# Patient Record
Sex: Male | Born: 1955 | Race: White | Hispanic: No | Marital: Married | Smoking: Never smoker
Health system: Southern US, Community
[De-identification: ages and names within clinical notes are randomized; demographics above are authoritative.]

## PROBLEM LIST (undated history)

## (undated) DIAGNOSIS — J302 Other seasonal allergic rhinitis: Secondary | ICD-10-CM

## (undated) DIAGNOSIS — I10 Essential (primary) hypertension: Secondary | ICD-10-CM

## (undated) DIAGNOSIS — E785 Hyperlipidemia, unspecified: Secondary | ICD-10-CM

## (undated) HISTORY — DX: Essential (primary) hypertension: I10

## (undated) HISTORY — PX: NASAL SINUS SURGERY: SHX719

## (undated) HISTORY — DX: Hyperlipidemia, unspecified: E78.5

## (undated) HISTORY — PX: SHOULDER SURGERY: SHX246

---

## 2000-05-09 ENCOUNTER — Ambulatory Visit (HOSPITAL_COMMUNITY): Admission: RE | Admit: 2000-05-09 | Discharge: 2000-05-09 | Payer: Self-pay | Admitting: Neurosurgery

## 2000-05-09 ENCOUNTER — Encounter: Payer: Self-pay | Admitting: Neurosurgery

## 2012-02-15 ENCOUNTER — Encounter (INDEPENDENT_AMBULATORY_CARE_PROVIDER_SITE_OTHER): Payer: Self-pay | Admitting: General Surgery

## 2012-02-15 ENCOUNTER — Ambulatory Visit (INDEPENDENT_AMBULATORY_CARE_PROVIDER_SITE_OTHER): Payer: BC Managed Care – PPO | Admitting: General Surgery

## 2012-02-15 VITALS — BP 142/90 | HR 72 | Temp 97.2°F | Resp 20 | Ht 76.0 in | Wt 213.0 lb

## 2012-02-15 DIAGNOSIS — E7889 Other lipoprotein metabolism disorders: Secondary | ICD-10-CM

## 2012-02-15 DIAGNOSIS — E882 Lipomatosis, not elsewhere classified: Secondary | ICD-10-CM

## 2012-02-15 NOTE — Progress Notes (Signed)
Patient ID: Juan Arroyo, male   DOB: Dec 06, 1955, 56 y.o.   MRN: [id number]  Chief Complaint  Patient presents with  . Lipoma    HPI Juan Arroyo is a 56 y.o. male.   HPI This is a 56 year old male with multiple subcutaneous masses that have been consistent with lipomas and present for a long time. There are number of these are bothering him. He also is worried due to the fact that he just has these lumps. He comes in today to discuss excision of some of these or all of them. He is also due to get foot surgery at some point and would like to discuss having these combined. Past Medical History  Diagnosis Date  . Hypertension   . Hyperlipidemia     Past Surgical History  Procedure Date  . Shoulder surgery     right  . Nasal sinus surgery     History reviewed. No pertinent family history.  Social History History  Substance Use Topics  . Smoking status: Never Smoker   . Smokeless tobacco: Not on file  . Alcohol Use: Yes    No Known Allergies  Current Outpatient Prescriptions  Medication Sig Dispense Refill  . aspirin 81 MG tablet Take 81 mg by mouth daily.      . cetirizine (ZYRTEC) 10 MG tablet Take 10 mg by mouth daily.      Marland Kitchen losartan-hydrochlorothiazide (HYZAAR) 100-12.5 MG per tablet Take 1 tablet by mouth daily.      . naproxen sodium (ANAPROX) 220 MG tablet Take 220 mg by mouth 2 (two) times daily with a meal.      . pravastatin (PRAVACHOL) 40 MG tablet Take 40 mg by mouth daily.        Review of Systems Review of Systems  Blood pressure 142/90, pulse 72, temperature 97.2 F (36.2 C), temperature source Temporal, resp. rate 20, height 6\' 4"  (1.93 m), weight 213 lb (96.616 kg).  Physical Exam Physical Exam  Vitals reviewed. Constitutional: He appears well-developed and well-nourished.  Musculoskeletal:       Arms:      Legs:     Assessment    Lipomatosis    Plan    I discussed with him that these did not need to be excised as they all  appear to be benign. He however would like to have these excised. I discussed 2 options. One would be excising these sequentially in the office although I think some of them will need to be done in the operating room specifically on his triceps area. He is due to get foot surgery and I  would be perfectly willing to remove most or all of these in the operating room in combination with his foot surgery. He is going to see Dr. Victorino Dike. If he agrees to this I will be more than happy to combine these procedures together. We discussed the risks and benefits of excision of these lipomas as well as the possible risks including seroma and possible further lipomas forming.       Koreen Lizaola 02/15/2012, 10:03 AM

## 2012-06-10 ENCOUNTER — Ambulatory Visit (INDEPENDENT_AMBULATORY_CARE_PROVIDER_SITE_OTHER): Payer: BC Managed Care – PPO | Admitting: General Surgery

## 2012-06-10 ENCOUNTER — Encounter (INDEPENDENT_AMBULATORY_CARE_PROVIDER_SITE_OTHER): Payer: Self-pay | Admitting: General Surgery

## 2012-06-10 VITALS — BP 134/84 | HR 76 | Temp 97.9°F | Ht 76.0 in | Wt 220.2 lb

## 2012-06-10 DIAGNOSIS — E7889 Other lipoprotein metabolism disorders: Secondary | ICD-10-CM

## 2012-06-10 DIAGNOSIS — E882 Lipomatosis, not elsewhere classified: Secondary | ICD-10-CM

## 2012-06-10 NOTE — Progress Notes (Signed)
Subjective:     Patient ID: Juan Arroyo, male   DOB: October 28, 1955, 56 y.o.   MRN: [id number]  HPI This is 47 yom who I have seen previously with multiple lipomas some of which are symptomatic.  He is going to have right foot surgery on 23 January with Dr. Victorino Dike and would like to combine procedures. He comes in today to discuss what lipomas he would like to have removed.  Review of Systems     Objective:   Physical Exam Left posterior triceps lipoma 3x2 cm Posterior left deltoid lipoma 1x2 cm Right posterior triceps lipoma 2x3 cm Right lateral abdominal wall lipoma 1x1 cm Left lateral abdominal wall lipoma at belt line <1 cm Left thigh lipoma 2x2 cm These are ones he would like removed He also has left biceps 1x1 cm, left forearm 1x1cm, right lateral arm 1x1 cm    Assessment:     Lipomatosis    Plan:     We discussed excision of the six above. These are most symptomatic and the others could be removed eventually in office if he would like. I told him these are all by choice and that they certainly clinically appear to be lipomas and don't have to be removed. They are however symptomatic.  We discussed risk of infection.  Will plan on doing at same time as foot surgery.

## 2012-07-29 ENCOUNTER — Encounter (HOSPITAL_BASED_OUTPATIENT_CLINIC_OR_DEPARTMENT_OTHER): Payer: Self-pay | Admitting: *Deleted

## 2012-07-29 NOTE — Progress Notes (Signed)
To come in for [id number]  

## 2012-07-30 ENCOUNTER — Encounter (HOSPITAL_BASED_OUTPATIENT_CLINIC_OR_DEPARTMENT_OTHER)
Admission: RE | Admit: 2012-07-30 | Discharge: 2012-07-30 | Disposition: A | Discharge status: Home / Self Care | Payer: BC Managed Care – PPO | Source: Ambulatory Visit | Attending: Orthopedic Surgery | Admitting: Orthopedic Surgery

## 2012-07-30 LAB — BASIC METABOLIC PANEL WITH GFR
BUN: 12 mg/dL (ref 6–23)
CO2: 27 meq/L (ref 19–32)
Calcium: 9.6 mg/dL (ref 8.4–10.5)
Chloride: 98 meq/L (ref 96–112)
Creatinine, Ser: 0.95 mg/dL (ref 0.50–1.35)
GFR calc Af Amer: >90 mL/min
GFR calc non Af Amer: >90 mL/min
Glucose, Bld: 95 mg/dL (ref 70–99)
Potassium: 4.3 meq/L (ref 3.5–5.1)
Sodium: 137 meq/L (ref 135–145)

## 2012-07-31 ENCOUNTER — Other Ambulatory Visit: Payer: Self-pay | Admitting: Orthopedic Surgery

## 2012-08-01 ENCOUNTER — Encounter (HOSPITAL_BASED_OUTPATIENT_CLINIC_OR_DEPARTMENT_OTHER): Payer: Self-pay | Admitting: Anesthesiology

## 2012-08-01 ENCOUNTER — Encounter (HOSPITAL_BASED_OUTPATIENT_CLINIC_OR_DEPARTMENT_OTHER)
Admission: RE | Disposition: A | Discharge status: Home / Self Care | Payer: Self-pay | Source: Ambulatory Visit | Attending: Orthopedic Surgery

## 2012-08-01 ENCOUNTER — Ambulatory Visit (HOSPITAL_BASED_OUTPATIENT_CLINIC_OR_DEPARTMENT_OTHER)
Admission: RE | Admit: 2012-08-01 | Discharge: 2012-08-01 | Disposition: A | Discharge status: Home / Self Care | Payer: BC Managed Care – PPO | Source: Ambulatory Visit | Attending: Orthopedic Surgery | Admitting: Orthopedic Surgery

## 2012-08-01 ENCOUNTER — Encounter (HOSPITAL_BASED_OUTPATIENT_CLINIC_OR_DEPARTMENT_OTHER): Payer: Self-pay | Admitting: *Deleted

## 2012-08-01 ENCOUNTER — Ambulatory Visit (HOSPITAL_BASED_OUTPATIENT_CLINIC_OR_DEPARTMENT_OTHER): Payer: BC Managed Care – PPO | Admitting: Anesthesiology

## 2012-08-01 DIAGNOSIS — D1739 Benign lipomatous neoplasm of skin and subcutaneous tissue of other sites: Secondary | ICD-10-CM

## 2012-08-01 DIAGNOSIS — Z79899 Other long term (current) drug therapy: Secondary | ICD-10-CM | POA: Insufficient documentation

## 2012-08-01 DIAGNOSIS — D1779 Benign lipomatous neoplasm of other sites: Secondary | ICD-10-CM | POA: Insufficient documentation

## 2012-08-01 DIAGNOSIS — M2010 Hallux valgus (acquired), unspecified foot: Secondary | ICD-10-CM | POA: Insufficient documentation

## 2012-08-01 DIAGNOSIS — Z01812 Encounter for preprocedural laboratory examination: Secondary | ICD-10-CM | POA: Insufficient documentation

## 2012-08-01 DIAGNOSIS — E785 Hyperlipidemia, unspecified: Secondary | ICD-10-CM | POA: Insufficient documentation

## 2012-08-01 DIAGNOSIS — I10 Essential (primary) hypertension: Secondary | ICD-10-CM | POA: Insufficient documentation

## 2012-08-01 DIAGNOSIS — Z7982 Long term (current) use of aspirin: Secondary | ICD-10-CM | POA: Insufficient documentation

## 2012-08-01 DIAGNOSIS — M2040 Other hammer toe(s) (acquired), unspecified foot: Secondary | ICD-10-CM | POA: Insufficient documentation

## 2012-08-01 HISTORY — PX: LIPOMA EXCISION: SHX5283

## 2012-08-01 HISTORY — PX: HAMMERTOE RECONSTRUCTION WITH WEIL OSTEOTOMY: SHX5631

## 2012-08-01 HISTORY — DX: Other seasonal allergic rhinitis: J30.2

## 2012-08-01 LAB — POCT HEMOGLOBIN-HEMACUE: Hemoglobin: 16.1 g/dL (ref 13.0–17.0)

## 2012-08-01 SURGERY — HAMMERTOE RECONSTRUCTION WITH WEIL OSTEOTOMY
Anesthesia: Regional | Site: Toe | Laterality: Right | Wound class: Clean

## 2012-08-01 MED ORDER — OXYCODONE HCL 5 MG PO TABS: 5.0000 mg | ORAL_TABLET | Freq: Once | ORAL | Status: DC | PRN

## 2012-08-01 MED ORDER — ONDANSETRON HCL 4 MG/2ML IJ SOLN: 4.0000 mg | Freq: Once | INTRAMUSCULAR | Status: DC | PRN

## 2012-08-01 MED ORDER — GLYCOPYRROLATE 0.2 MG/ML IJ SOLN
INTRAMUSCULAR | Status: DC | PRN
  Administered 2012-08-01: 0.2 mg via INTRAVENOUS

## 2012-08-01 MED ORDER — OXYCODONE HCL 5 MG PO TABS: 5.0000 mg | ORAL_TABLET | ORAL | Status: DC | PRN

## 2012-08-01 MED ORDER — LIDOCAINE HCL (CARDIAC) 20 MG/ML IV SOLN
INTRAVENOUS | Status: DC | PRN
  Administered 2012-08-01: 60 mg via INTRAVENOUS

## 2012-08-01 MED ORDER — BUPIVACAINE-EPINEPHRINE PF 0.5-1:200000 % IJ SOLN
INTRAMUSCULAR | Status: DC | PRN
  Administered 2012-08-01: 25 mL

## 2012-08-01 MED ORDER — CHLORHEXIDINE GLUCONATE 4 % EX LIQD: 60.0000 mL | Freq: Once | CUTANEOUS | Status: DC

## 2012-08-01 MED ORDER — LIDOCAINE HCL 1 % IJ SOLN
INTRAMUSCULAR | Status: DC | PRN
  Administered 2012-08-01: 08:00:00 via INTRAMUSCULAR

## 2012-08-01 MED ORDER — ONDANSETRON HCL 4 MG/2ML IJ SOLN
INTRAMUSCULAR | Status: DC | PRN
  Administered 2012-08-01: 4 mg via INTRAVENOUS

## 2012-08-01 MED ORDER — DEXAMETHASONE SODIUM PHOSPHATE 10 MG/ML IJ SOLN
INTRAMUSCULAR | Status: DC | PRN
  Administered 2012-08-01: 7 mg
  Administered 2012-08-01: 10 mg via INTRAVENOUS

## 2012-08-01 MED ORDER — MIDAZOLAM HCL 2 MG/2ML IJ SOLN
1.0000 mg | INTRAMUSCULAR | Status: DC | PRN
  Administered 2012-08-01: 2 mg via INTRAVENOUS

## 2012-08-01 MED ORDER — CEFAZOLIN SODIUM-DEXTROSE 2-3 GM-% IV SOLR
2.0000 g | INTRAVENOUS | Status: AC
  Administered 2012-08-01: 2 g via INTRAVENOUS

## 2012-08-01 MED ORDER — BACITRACIN ZINC 500 UNIT/GM EX OINT
TOPICAL_OINTMENT | CUTANEOUS | Status: DC | PRN
  Administered 2012-08-01: 1 via TOPICAL

## 2012-08-01 MED ORDER — PROPOFOL 10 MG/ML IV BOLUS
INTRAVENOUS | Status: DC | PRN
  Administered 2012-08-01: 300 mg via INTRAVENOUS

## 2012-08-01 MED ORDER — HYDROMORPHONE HCL PF 1 MG/ML IJ SOLN: 0.2500 mg | INTRAMUSCULAR | Status: DC | PRN

## 2012-08-01 MED ORDER — EPHEDRINE SULFATE 50 MG/ML IJ SOLN
INTRAMUSCULAR | Status: DC | PRN
  Administered 2012-08-01 (×4): 10 mg via INTRAVENOUS

## 2012-08-01 MED ORDER — LACTATED RINGERS IV SOLN
INTRAVENOUS | Status: DC
  Administered 2012-08-01 (×3): via INTRAVENOUS

## 2012-08-01 MED ORDER — SODIUM CHLORIDE 0.9 % IV SOLN: INTRAVENOUS | Status: DC

## 2012-08-01 MED ORDER — FENTANYL CITRATE 0.05 MG/ML IJ SOLN
50.0000 ug | INTRAMUSCULAR | Status: DC | PRN
  Administered 2012-08-01: 100 ug via INTRAVENOUS

## 2012-08-01 MED ORDER — OXYCODONE HCL 5 MG/5ML PO SOLN: 5.0000 mg | Freq: Once | ORAL | Status: DC | PRN

## 2012-08-01 MED ORDER — FENTANYL CITRATE 0.05 MG/ML IJ SOLN
INTRAMUSCULAR | Status: DC | PRN
  Administered 2012-08-01: 50 ug via INTRAVENOUS
  Administered 2012-08-01: 100 ug via INTRAVENOUS

## 2012-08-01 SURGICAL SUPPLY — 102 items
ADH SKN CLS APL DERMABOND .7 (GAUZE/BANDAGES/DRESSINGS) ×8
BANDAGE CONFORM 2  STR LF (GAUZE/BANDAGES/DRESSINGS) ×3 IMPLANT
BANDAGE CONFORM 3  STR LF (GAUZE/BANDAGES/DRESSINGS) IMPLANT
BANDAGE ESMARK 6X9 LF (GAUZE/BANDAGES/DRESSINGS) IMPLANT
BIT DRILL 1.7 LOW PROFILE (BIT) ×1 IMPLANT
BLADE AVERAGE 25X9 (BLADE) ×3 IMPLANT
BLADE SURG 15 STRL LF DISP TIS (BLADE) ×6 IMPLANT
BLADE SURG 15 STRL SS (BLADE) ×24
BLADE SURG ROTATE 9660 (MISCELLANEOUS) ×1 IMPLANT
BNDG CMPR 9X4 STRL LF SNTH (GAUZE/BANDAGES/DRESSINGS)
BNDG CMPR 9X6 STRL LF SNTH (GAUZE/BANDAGES/DRESSINGS) ×2
BNDG COHESIVE 4X5 TAN STRL (GAUZE/BANDAGES/DRESSINGS) ×3 IMPLANT
BNDG ESMARK 4X9 LF (GAUZE/BANDAGES/DRESSINGS) IMPLANT
BNDG ESMARK 6X9 LF (GAUZE/BANDAGES/DRESSINGS) ×3
CANISTER SUCTION 1200CC (MISCELLANEOUS) IMPLANT
CAP PIN PROTECTOR ORTHO WHT (CAP) ×1 IMPLANT
CHLORAPREP W/TINT 26ML (MISCELLANEOUS) ×6 IMPLANT
CLOTH BEACON ORANGE TIMEOUT ST (SAFETY) ×6 IMPLANT
COVER MAYO STAND STRL (DRAPES) ×4 IMPLANT
COVER TABLE BACK 60X90 (DRAPES) ×6 IMPLANT
CUFF TOURNIQUET SINGLE 18IN (TOURNIQUET CUFF) IMPLANT
CUFF TOURNIQUET SINGLE 34IN LL (TOURNIQUET CUFF) ×1 IMPLANT
DECANTER SPIKE VIAL GLASS SM (MISCELLANEOUS) IMPLANT
DERMABOND ADVANCED (GAUZE/BANDAGES/DRESSINGS) ×4
DERMABOND ADVANCED .7 DNX12 (GAUZE/BANDAGES/DRESSINGS) ×2 IMPLANT
DRAPE EXTREMITY T 121X128X90 (DRAPE) ×3 IMPLANT
DRAPE OEC MINIVIEW 54X84 (DRAPES) ×3 IMPLANT
DRAPE PED LAPAROTOMY (DRAPES) ×2 IMPLANT
DRAPE U-SHAPE 47X51 STRL (DRAPES) ×3 IMPLANT
DRSG EMULSION OIL 3X3 NADH (GAUZE/BANDAGES/DRESSINGS) ×4 IMPLANT
DRSG PAD ABDOMINAL 8X10 ST (GAUZE/BANDAGES/DRESSINGS) ×2 IMPLANT
DRSG TEGADERM 4X4.75 (GAUZE/BANDAGES/DRESSINGS) IMPLANT
ELECT COATED BLADE 2.86 ST (ELECTRODE) ×1 IMPLANT
ELECT REM PT RETURN 9FT ADLT (ELECTROSURGICAL) ×3
ELECTRODE REM PT RTRN 9FT ADLT (ELECTROSURGICAL) ×4 IMPLANT
GAUZE PACKING IODOFORM 1/4X5 (PACKING) IMPLANT
GAUZE SPONGE 4X4 12PLY STRL LF (GAUZE/BANDAGES/DRESSINGS) ×1 IMPLANT
GLOVE BIO SURGEON STRL SZ 6.5 (GLOVE) ×2 IMPLANT
GLOVE BIO SURGEON STRL SZ7 (GLOVE) ×4 IMPLANT
GLOVE BIO SURGEON STRL SZ8 (GLOVE) ×4 IMPLANT
GLOVE BIOGEL PI IND STRL 7.0 (GLOVE) IMPLANT
GLOVE BIOGEL PI IND STRL 7.5 (GLOVE) ×2 IMPLANT
GLOVE BIOGEL PI IND STRL 8 (GLOVE) ×2 IMPLANT
GLOVE BIOGEL PI INDICATOR 7.0 (GLOVE) ×2
GLOVE BIOGEL PI INDICATOR 7.5 (GLOVE) ×1
GLOVE BIOGEL PI INDICATOR 8 (GLOVE) ×2
GLOVE ECLIPSE 6.5 STRL STRAW (GLOVE) ×1 IMPLANT
GLOVE EXAM NITRILE EXT CUFF MD (GLOVE) ×2 IMPLANT
GOWN PREVENTION PLUS XLARGE (GOWN DISPOSABLE) ×8 IMPLANT
GOWN PREVENTION PLUS XXLARGE (GOWN DISPOSABLE) ×4 IMPLANT
K-WIRE .054X4 (WIRE) ×1 IMPLANT
K-WIRE SGLE END .054 LG (WIRE) ×6
KWIRE SGLE END .054 LG (WIRE) IMPLANT
NDL HYPO 25X1 1.5 SAFETY (NEEDLE) ×2 IMPLANT
NEEDLE HYPO 22GX1.5 SAFETY (NEEDLE) IMPLANT
NEEDLE HYPO 25X1 1.5 SAFETY (NEEDLE) ×3 IMPLANT
NS IRRIG 1000ML POUR BTL (IV SOLUTION) ×3 IMPLANT
PACK BASIN DAY SURGERY FS (CUSTOM PROCEDURE TRAY) ×6 IMPLANT
PAD CAST 4YDX4 CTTN HI CHSV (CAST SUPPLIES) ×2 IMPLANT
PADDING CAST ABS 4INX4YD NS (CAST SUPPLIES)
PADDING CAST ABS COTTON 4X4 ST (CAST SUPPLIES) ×2 IMPLANT
PADDING CAST COTTON 4X4 STRL (CAST SUPPLIES) ×3
PASSER SUT SWANSON 36MM LOOP (INSTRUMENTS) IMPLANT
PENCIL BUTTON HOLSTER BLD 10FT (ELECTRODE) ×4 IMPLANT
SCREW CORTICAL 2.3X18 (Screw) ×1 IMPLANT
SCREW LP CORTICAL 2.3X22MM (Screw) ×1 IMPLANT
SCREW QUICK FIX 2X12 (Screw) ×2 IMPLANT
SCREW QUICK FIX 2X14 (Screw) ×1 IMPLANT
SHEET MEDIUM DRAPE 40X70 STRL (DRAPES) ×3 IMPLANT
SLEEVE SCD COMPRESS KNEE MED (MISCELLANEOUS) ×3 IMPLANT
SPONGE GAUZE 4X4 12PLY (GAUZE/BANDAGES/DRESSINGS) ×1 IMPLANT
SPONGE LAP 18X18 X RAY DECT (DISPOSABLE) ×3 IMPLANT
STAPLER VISISTAT 35W (STAPLE) ×1 IMPLANT
STOCKINETTE 6  STRL (DRAPES) ×1
STOCKINETTE 6 STRL (DRAPES) ×2 IMPLANT
SUCTION FRAZIER TIP 10 FR DISP (SUCTIONS) IMPLANT
SUT ETHILON 2 0 FS 18 (SUTURE) ×2 IMPLANT
SUT ETHILON 3 0 FSL (SUTURE) IMPLANT
SUT ETHILON 3 0 PS 1 (SUTURE) ×3 IMPLANT
SUT MERSILENE 2.0 SH NDLE (SUTURE) IMPLANT
SUT MNCRL AB 3-0 PS2 18 (SUTURE) ×3 IMPLANT
SUT MNCRL AB 4-0 PS2 18 (SUTURE) ×3 IMPLANT
SUT SILK 2 0 SH (SUTURE) IMPLANT
SUT VIC AB 2-0 CT1 27 (SUTURE)
SUT VIC AB 2-0 CT1 TAPERPNT 27 (SUTURE) IMPLANT
SUT VIC AB 2-0 SH 18 (SUTURE) IMPLANT
SUT VIC AB 2-0 SH 27 (SUTURE)
SUT VIC AB 2-0 SH 27XBRD (SUTURE) IMPLANT
SUT VIC AB 3-0 PS1 18 (SUTURE)
SUT VIC AB 3-0 PS1 18XBRD (SUTURE) IMPLANT
SUT VICRYL 3-0 CR8 SH (SUTURE) ×1 IMPLANT
SUT VICRYL 4-0 PS2 18IN ABS (SUTURE) IMPLANT
SWAB COLLECTION DEVICE MRSA (MISCELLANEOUS) IMPLANT
SYR BULB 3OZ (MISCELLANEOUS) ×3 IMPLANT
SYR CONTROL 10ML LL (SYRINGE) ×3 IMPLANT
TOWEL OR 17X24 6PK STRL BLUE (TOWEL DISPOSABLE) ×9 IMPLANT
TOWEL OR NON WOVEN STRL DISP B (DISPOSABLE) ×3 IMPLANT
TUBE ANAEROBIC SPECIMEN COL (MISCELLANEOUS) IMPLANT
TUBE CONNECTING 20X1/4 (TUBING) IMPLANT
UNDERPAD 30X30 INCONTINENT (UNDERPADS AND DIAPERS) ×3 IMPLANT
WATER STERILE IRR 1000ML POUR (IV SOLUTION) ×2 IMPLANT
YANKAUER SUCT BULB TIP NO VENT (SUCTIONS) IMPLANT

## 2012-08-01 NOTE — Brief Op Note (Signed)
08/01/2012  10:19 AM  PATIENT:  Juan Arroyo  57 y.o. 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  Right 1st MT scarf osteotomy 2.  Right modified mcBride bunionectomy 3.  Right 2nd, 3rd and 4th MT Weil osteotomies 4.  Right 2nd, 3rd, 4th and 5th MTP joint dorsal capsulotomies with extensor tendon lengthening 5.  Right 2nd, 3rd, 4th and 5th toe percutaneous FDL tenotomies 6.  Right 2nd, 3rd and 4th toe hammertoe corrections 7.  Fluoro > 1 hour  SURGEON:  Toni Arthurs, MD  ASSISTANT: n/a  ANESTHESIA:   General, regional  EBL:  minimal   TOURNIQUET:   Total Tourniquet Time Documented: Thigh (Right) - 82 minutes  COMPLICATIONS:  None apparent  DISPOSITION:  Extubated, awake and stable to recovery.  DICTATION ID:   [id number]

## 2012-08-01 NOTE — OR Nursing (Signed)
Dr. Victorino Dike scrubbed in for foot procedure @ 0815, incision @ 0825.  Dr. Dwain Sarna completed his procedure @ 0845.

## 2012-08-01 NOTE — H&P (Signed)
  HPI  This is a 57 year old male with multiple subcutaneous masses that have been consistent with lipomas and present for a long time. There are number of these are bothering him. He also is worried due to the fact that he just has these lumps. He comes in today to discuss excision of some of these or all of them. He is also due to get foot surgery at some point and would like to discuss having these combined.  Past Medical History   Diagnosis  Date   .  Hypertension    .  Hyperlipidemia     Past Surgical History   Procedure  Date   .  Shoulder surgery      right   .  Nasal sinus surgery     History reviewed. No pertinent family history.  Social History  History   Substance Use Topics   .  Smoking status:  Never Smoker   .  Smokeless tobacco:  Not on file   .  Alcohol Use:  Yes    No Known Allergies  Current Outpatient Prescriptions   Medication  Sig  Dispense  Refill   .  aspirin 81 MG tablet  Take 81 mg by mouth daily.     .  cetirizine (ZYRTEC) 10 MG tablet  Take 10 mg by mouth daily.     Marland Kitchen  losartan-hydrochlorothiazide (HYZAAR) 100-12.5 MG per tablet  Take 1 tablet by mouth daily.     .  naproxen sodium (ANAPROX) 220 MG tablet  Take 220 mg by mouth 2 (two) times daily with a meal.     .  pravastatin (PRAVACHOL) 40 MG tablet  Take 40 mg by mouth daily.      Review of Systems  Review of Systems  Blood pressure 142/90, pulse 72, temperature 97.2 F (36.2 C), temperature source Temporal, resp. rate 20, height 6\' 4"  (1.93 m), weight 213 lb (96.616 kg).  Physical Exam  Physical Exam  Vitals reviewed.  Constitutional: He appears well-developed and well-nourished.  Left posterior triceps lipoma 3x2 cm  Posterior left deltoid lipoma 1x2 cm  Right posterior triceps lipoma 2x3 cm  Right lateral abdominal wall lipoma 1x1 cm  Left lateral abdominal wall lipoma at belt line <1 cm  Left thigh lipoma 2x2 cm  These are ones he would like removed  He also has left biceps 1x1 cm, left  forearm 1x1cm, right lateral arm 1x1 cm   Assessment   Lipomatosis   Plan   I discussed with him that these did not need to be excised as they all appear to be benign. He however would like to have these excised. I discussed 2 options. One would be excising these sequentially in the office although I think some of them will need to be done in the operating room specifically on his triceps area. He is due to get foot surgery and I would be perfectly willing to remove most or all of these in the operating room in combination with his foot surgery. He is going to see Dr. Victorino Dike. If he agrees to this I will be more than happy to combine these procedures together. We discussed the risks and benefits of excision of these lipomas as well as the possible risks including seroma and possible further lipomas forming.

## 2012-08-01 NOTE — Anesthesia Preprocedure Evaluation (Signed)

## 2012-08-01 NOTE — Anesthesia Procedure Notes (Addendum)
Anesthesia Regional Block:  Ankle block  Pre-Anesthetic Checklist: ,, timeout performed, Correct Patient, Correct Site, Correct Laterality, Correct Procedure, Correct Position, site marked, Risks and benefits discussed,  Surgical consent,  Pre-op evaluation,  At surgeon's request and post-op pain management  Laterality: Right and Lower  Prep: chloraprep       Needles:  Injection technique: Single-shot  Needle Type: Quincke     Needle Length: 9cm  Needle Gauge: 22 and 22 G    Additional Needles:  Procedures: ultrasound guided (picture in chart) Ankle block Narrative:  Start time: 08/01/2012 7:06 AM End time: 08/01/2012 7:12 AM Injection made incrementally with aspirations every 5 mL.  Performed by: Personally  Anesthesiologist: Sheldon Silvan, MD  Additional Notes: Marcaine 0.5% with EPI [id number]  Femoral nerve block Procedure Name: LMA Insertion Date/Time: 08/01/2012 8:43 AM Performed by: Burna Cash Pre-anesthesia Checklist: Patient identified, Emergency Drugs available, Suction available and Patient being monitored Patient Re-evaluated:Patient Re-evaluated prior to inductionOxygen Delivery Method: Circle System Utilized Preoxygenation: Pre-oxygenation with 100% oxygen Intubation Type: IV induction Ventilation: Mask ventilation without difficulty LMA: LMA inserted LMA Size: 5.0 Number of attempts: 1 Airway Equipment and Method: bite block Placement Confirmation: positive ETCO2 Tube secured with: Tape Dental Injury: Teeth and Oropharynx as per pre-operative assessment

## 2012-08-01 NOTE — Anesthesia Postprocedure Evaluation (Signed)
  Anesthesia Post-op Note  Patient: Juan Arroyo  Procedure(s) Performed: Procedure(s) (LRB) with comments: HAMMERTOE RECONSTRUCTION WITH WEIL OSTEOTOMY (Right) - RIGHT SCARF OSTEOTOMY, MODIFIED MCBRIDE BUNIONECTOMY, , RIGHT 2ND - 4TH WEIL, RIGHT 2ND - 5TH DORSAL CAPSULOTOMIES, RIGHT 2ND - 4TH PIP ARTHRODESIS EXCISION LIPOMA (N/A) - EXCISION LIPOMAS X 6 (LEFT TRICEP, RIGHT TRICEP, DELTOID, BIL ABDOMINAL WALL AND LEFT THIGH)  Patient Location: PACU  Anesthesia Type:GA combined with regional for post-op pain  Level of Consciousness: awake, alert  and oriented  Airway and Oxygen Therapy: Patient Spontanous Breathing  Post-op Pain: none  Post-op Assessment: Post-op Vital signs reviewed  Post-op Vital Signs: Reviewed  Complications: No apparent anesthesia complications

## 2012-08-01 NOTE — Transfer of Care (Signed)
Immediate Anesthesia Transfer of Care Note  Patient: Juan Arroyo  Procedure(s) Performed: Procedure(s) (LRB) with comments: HAMMERTOE RECONSTRUCTION WITH WEIL OSTEOTOMY (Right) - RIGHT SCARF OSTEOTOMY, MODIFIED MCBRIDE BUNIONECTOMY, , RIGHT 2ND - 4TH WEIL, RIGHT 2ND - 5TH DORSAL CAPSULOTOMIES, RIGHT 2ND - 4TH PIP ARTHRODESIS EXCISION LIPOMA (N/A) - EXCISION LIPOMAS X 6 (LEFT TRICEP, RIGHT TRICEP, DELTOID, BIL ABDOMINAL WALL AND LEFT THIGH)  Patient Location: PACU  Anesthesia Type:GA combined with regional for post-op pain  Level of Consciousness: sedated  Airway & Oxygen Therapy: Patient Spontanous Breathing and Patient connected to face mask oxygen  Post-op Assessment: Report given to PACU RN and Post -op Vital signs reviewed and stable  Post vital signs: Reviewed and stable  Complications: No apparent anesthesia complications

## 2012-08-01 NOTE — Transfer of Care (Signed)
Immediate Anesthesia Transfer of Care Note  Patient: Juan Arroyo  Procedure(s) Performed: Procedure(s) (LRB) with comments: HAMMERTOE RECONSTRUCTION WITH WEIL OSTEOTOMY (Right) - RIGHT SCARF OSTEOTOMY, MODIFIED MCBRIDE BUNIONECTOMY, , RIGHT 2ND - 4TH WEIL, RIGHT 2ND - 5TH DORSAL CAPSULOTOMIES, RIGHT 2ND - 4TH PIP ARTHRODESIS EXCISION LIPOMA (N/A) - EXCISION LIPOMAS X 6 (LEFT TRICEP, RIGHT TRICEP, DELTOID, BIL ABDOMINAL WALL AND LEFT THIGH)  Patient Location: PACU  Anesthesia Type:MAC combined with regional for post-op pain  Level of Consciousness: sedated  Airway & Oxygen Therapy: Patient Spontanous Breathing and Patient connected to face mask oxygen  Post-op Assessment: Report given to PACU RN and Post -op Vital signs reviewed and stable  Post vital signs: Reviewed and stable  Complications: No apparent anesthesia complications

## 2012-08-01 NOTE — Op Note (Signed)
Preoperative diagnosis: Multiple lipomas Postoperative diagnosis: Same as above Procedure: Excision of the following Left posterior triceps lipoma 3x2 cm  Posterior left deltoid lipoma 1x2 cm  Right posterior triceps lipoma 2x3 cm  Right lateral abdominal wall lipoma 1x1 cm  Left lateral abdominal wall lipoma at belt line <1 cm  Left thigh lipoma 2x2 cm Surgeon: Dr. Harden Mo Anesthesia: Gen.  Estimated blood loss: Minimal Specimens: See above Consultations: None Sponge and needle count correct Disposition case turned over to Dr. Victorino Dike  Indications: This is a 19 healthy male who presents with multiple lipomas that are symptomatic to him. We discussed removing 6 of these this is going to be in conjunction with a right foot surgery as well.  Procedure: After informed consent was obtained the patient was taken to the operating room. He was given antibiotics. I prepped and draped all of these areas separately. A surgical timeout was performed prior to beginning. I did my portion of the procedure prior to his foot surgery.  I first excised the right abdominal wall lipoma. I then excised the left thigh lipoma as well as the left abdominal wall lipoma. I then excised the right triceps lipoma, left triceps lipoma, and left deltoid lipoma. These were all closed with 3-0 Vicryl and Dermabond. They were all hemostatic upon completion. All of them except for the left deltoid were sent for pathology. This one really disintegrated I removed it was very small. He tolerated all this well the case was turned over orthopedics.

## 2012-08-01 NOTE — Progress Notes (Signed)
Assisted Dr. Crews with right, ankle block. Side rails up, monitors on throughout procedure. See vital signs in flow sheet. Tolerated Procedure well. 

## 2012-08-01 NOTE — H&P (Signed)
Juan Arroyo is an 57 y.o. male.   Chief Complaint: right hallux valgus and hammertoes HPI: 57 y/o male with right hallux valgus and 2-5 hammertoes.  He presents now for operative treatment of these painful forefoot deformities.  Past Medical History  Diagnosis Date  . Hypertension   . Hyperlipidemia   . Seasonal allergies     Past Surgical History  Procedure Date  . Shoulder surgery     right  . Nasal sinus surgery     History reviewed. No pertinent family history. Social History:  reports that he has never smoked. He does not have any smokeless tobacco history on file. He reports that he drinks alcohol. He reports that he does not use illicit drugs.  Allergies: No Known Allergies  Medications Prior to Admission  Medication Sig Dispense Refill  . aspirin 81 MG tablet Take 81 mg by mouth daily.      . cetirizine (ZYRTEC) 10 MG tablet Take 10 mg by mouth daily.      Marland Kitchen losartan-hydrochlorothiazide (HYZAAR) 100-12.5 MG per tablet Take 1 tablet by mouth daily.      . naproxen sodium (ANAPROX) 220 MG tablet Take 220 mg by mouth 2 (two) times daily with a meal.      . pravastatin (PRAVACHOL) 40 MG tablet Take 40 mg by mouth daily.        Results for orders placed during the hospital encounter of 08-30-2012 (from the past 48 hour(s))  BASIC METABOLIC PANEL     Status: Normal   Collection Time   07/30/12  9:30 AM      Component Value Range Comment   Sodium 137  135 - 145 mEq/L    Potassium 4.3  3.5 - 5.1 mEq/L    Chloride 98  96 - 112 mEq/L    CO2 27  19 - 32 mEq/L    Glucose, Bld 95  70 - 99 mg/dL    BUN 12  6 - 23 mg/dL    Creatinine, Ser 1.61  0.50 - 1.35 mg/dL    Calcium 9.6  8.4 - 09.6 mg/dL    GFR calc non Af Amer >90  >90 mL/min    GFR calc Af Amer >90  >90 mL/min   POCT HEMOGLOBIN-HEMACUE     Status: Normal   Collection Time   08-30-2012  6:50 AM      Component Value Range Comment   Hemoglobin 16.1  13.0 - 17.0 g/dL    No results found.  ROS  No recent f/c/n/v/wt  loss.  Blood pressure 133/84, pulse 66, temperature 97.8 F (36.6 C), temperature source Oral, resp. rate 14, height 6\' 4"  (1.93 m), weight 97.297 kg (214 lb 8 oz), SpO2 99.00%. Physical Exam  wn wd male in nad.  A and O x 4.  Mood and affect normal.  EOMI.  Respirations unlabored.  R foot with healthy and intact skin.  No lymphadenopathy.  Normal sens to LT throughout the foot.  Moderate hallux valgus and 2-5 hammertoes.  5/5 strength in PF and DF of the ankle.  Assessment/Plan Right hallux valgus and 2-5 hammertoes - to OR for correction of forefoot deformities.  The risks and benefits of the alternative treatment options have been discussed in detail.  The patient wishes to proceed with surgery and specifically understands risks of bleeding, infection, nerve damage, blood clots, need for additional surgery, amputation and death.   Toni Arthurs 30-Aug-2012, 7:30 AM

## 2012-08-02 ENCOUNTER — Encounter (HOSPITAL_BASED_OUTPATIENT_CLINIC_OR_DEPARTMENT_OTHER): Payer: Self-pay | Admitting: Orthopedic Surgery

## 2012-08-02 NOTE — Op Note (Signed)
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  PROCEDURE: 1. Right first metatarsal scarf osteotomy. 2. Right modified McBride bunionectomy. 3. Right second, third, and fourth metatarsal Weil osteotomies. 4. Right second, third, fourth, and fifth metatarsophalangeal joint     dorsal capsulotomies with extensor tendon lengthening. 5. Right second, third, fourth, and fifth toe percutaneous flexor     digitorum longus tenotomies. 6. Right second, third, and fourth toe hammertoe corrections (proximal     interphalangeal joint arthrodesis). 7. Intraoperative interpretation of fluoroscopic imaging greater than     1 hour.  SURGEON:  Toni Arthurs, MD.  ANESTHESIA:  General, regional.  ESTIMATED BLOOD LOSS:  Minimal.  TOURNIQUET TIME:  82 minutes at 250 mmHg.  COMPLICATIONS:  None apparent.  DISPOSITION:  Extubated, awake, and stable to recovery.  INDICATIONS FOR PROCEDURE:  The patient is a 57 year old male with long history of painful right forefoot deformities.  These include a bunion deformity as well as 2nd through 5th hammertoes.  He has failed treatment with activity modification, oral anti-inflammatories and shoe wear modifications.  He presents now for operative treatment of this painful forefoot deformities.  He understands the risks and benefits, the alternative treatment options and elects surgical treatment.  He specifically understands risks of bleeding, infection, nerve damage, blood clots, need for additional surgery, amputation, and  death.  For the patient's convenience, we scheduled this in conjunction with excision of multiple lipomas of the torso left lower extremity and bilateral upper extremities by Dr. Emelia Loron.  PROCEDURE IN DETAIL:  After preoperative consent was obtained and the correct operative site was identified, the patient was brought to the operating room and placed supine on the operating table.  General anesthesia was induced.  Preoperative antibiotics were administered.  At this point, Dr. Dwain Sarna completed his portion of the case removing multiple lipomas from the torso left thigh and bilateral upper extremities.  When his portion of the case was completed, I took over. A separate time-out was taken, confirming the right lower extremity as the correct operative site for bunion hammertoe corrections.  The right lower extremity was then prepped and draped in standard sterile fashion with a tourniquet around the thigh.  The extremity was exsanguinated and tourniquet was inflated to 250 mmHg.  A longitudinal incision was made over the medial eminence, sharp dissection was carried down through the skin and subcutaneous tissue.  The medial joint capsule was incised in line with its fibers and released plantarly and dorsally exposing the metatarsal head.  The medial eminence was resected medial to the sagittal sulcus in line with the first metatarsal shaft.  The knife blade was then inserted between the metatarsal head and the lateral  sesamoid releasing the dorsal joint capsule proximally and distally. Several perforations were made in the lateral joint capsule as well. This allowed passive positioning of the hallux MP joint in approximately 30 degrees of varus.  The adductor tendon was left intact.  Two 0.054 K- wires were then inserted perpendicular to long axis of the shaft of the metatarsal to mark the osteotomy.  A Scarf osteotomy was then made with an oscillating saw, removing a small  wafer of bone proximally.  The osteotomy was mobilized.  The metatarsal head was translated laterally to correct the intermetatarsal angle and the reduction was clamped with a tenaculum.  An AP simulated weightbearing fluoroscopic image was obtained confirming appropriate correction of the intermetatarsal angle. The sesamoids were noted to be appropriately covered.  The osteotomy was then fixed with a unicortical 2.3 fully-threaded screw angled into the metatarsal head and proximally with a bicortical 2.3 mm fully threaded cortical screw.  The overhanging bone was resected with an oscillating saw medially.  Wound was irrigated copiously.  Attention was then turned to the dorsum of the foot, where a longitudinal incision was made over the 2nd MTP joint.  Sharp dissection was carried down through the skin and subcutaneous tissue.  Blunt dissection was carried down around the extensor tendon.  The extensor digitorum longus and brevis tendons were lengthened in Z-fashion.  The dorsal joint capsule was excised in its entirety.  It was noted to be quite thickened.  The longitudinal incision was then made over the third MTP joint and again the extensor tendons were lengthened and the dorsal joint capsule was excised.  This was repeated for the 4th and 5th toes as well.  Attention was then turned to the 2nd MTP joint incision, where a Weil osteotomy was made with an oscillating saw.  A small wafer of bone was removed dorsally and distally.  The head was allowed to retract proximally several mm and the osteotomy was fixed with a unicortical 2 mm partially threaded screw.  The overhanging bone was trimmed with a rongeur.  Attention was then turned to the third MTP joint incision, where this Weil osteotomy was completed for the third metatarsal and again this was repeated for the 4th metatarsal through that separate incision.  The fluoroscopic image in the AP plane showed appropriate correction  of the metatarsal length.  Attention was then turned to the plantar aspect of the second toe.  The distal flexion crease was identified.  The #15 blade was inserted and angled approximately 45 degrees.  The flexor digitorum longus tendon was released from its insertion on the distal phalanx.  This percutaneous tenotomy was then repeated at the 3rd, 4th, and 5th toes, through separate incisions.  At this point, the toes could be passively corrected with the exception of fixed PIP joint flexion contractures.  Attention was then turned to the second toe, where a transverse incision was made over the PIP joint dorsally.  Sharp dissection was carried down through the skin and the extensor mechanism.  The head of the proximal phalanx was exposed.  An oscillating saw was used to resect the head of the proximal phalanx deep to the subchondral bone.  The base of the middle phalanx was exposed and again resected with an oscillating saw below the level of the subchondral bone.  This was then repeated for the third and fourth toes through separate incisions.  A 0.054 K-wire was then inserted out through the tip of the second toe and then advanced  retrograde across the PIP joint.  It was advanced across the MP joint holding the second toe out straight in a reduced position.  The 3rd and 4th toes were pinned in similar fashion.  The 5th toe was pinned retrograde from the tip of the toe across the IP joints and the MP joint again holding the toe in a reduced position.  Final AP and lateral fluoroscopic images showed appropriate position and length of all hardware and appropriate reduction of the 2nd through 5th hammertoe deformities.  The hallux valgus deformity was also noted to be appropriately corrected.  The 4 pins were bent, trimmed, and capped. All the wounds were irrigated copiously.  The extensor tendons were all repaired with 2-0 Vicryl figure-of-eight sutures.  They were all repaired in  their length and state.  The dorsal toe incisions were all closed with horizontal mattress sutures of 3-0 nylon.  Longitudinal dorsal foot incisions were all closed with running sutures of 2-0 nylon. The medial joint capsule of the hallux was repaired with imbricating sutures of 2-0 Vicryl.  Subcutaneous tissue was approximated with inverted simple sutures of 2-0 Vicryl.  A running 2-0 nylon was used to close the skin incision.  Sterile dressings were applied, followed by a compression wrap.  The tourniquet had been released at 82 minutes after. Hemostasis was achieved prior to closure of the incisions.  The patient was then awakened from anesthesia and transported to the recovery room in stable condition.  FOLLOWUP PLAN:  The patient will be weightbearing as tolerated on his heel and a Darco shoe.  He will follow up me in 2 weeks for suture removal.     Toni Arthurs, MD     JH/MEDQ  D:  08/01/2012  T:  08/02/2012  Job:  [id number]

## 2012-08-15 ENCOUNTER — Encounter (INDEPENDENT_AMBULATORY_CARE_PROVIDER_SITE_OTHER): Payer: BC Managed Care – PPO | Admitting: General Surgery

## 2012-08-16 ENCOUNTER — Ambulatory Visit (INDEPENDENT_AMBULATORY_CARE_PROVIDER_SITE_OTHER): Payer: BC Managed Care – PPO | Admitting: General Surgery

## 2012-08-16 ENCOUNTER — Encounter (INDEPENDENT_AMBULATORY_CARE_PROVIDER_SITE_OTHER): Payer: Self-pay | Admitting: General Surgery

## 2012-08-16 VITALS — BP 142/82 | HR 60 | Temp 97.4°F | Resp 18 | Ht 76.0 in | Wt 212.2 lb

## 2012-08-16 DIAGNOSIS — Z09 Encounter for follow-up examination after completed treatment for conditions other than malignant neoplasm: Secondary | ICD-10-CM

## 2012-08-16 NOTE — Progress Notes (Signed)
Subjective:     Patient ID: Juan Arroyo, male   DOB: 1955/07/31, 57 y.o.   MRN: [id number]  HPI  This is a 57 year old male who was undergoing right foot surgery that I removed 6 lipomas at the same time. He was were scattered throughout his body. He has done well from those and has no complaints about any of the sites. He comes in today for followup. Review of Systems     Objective:   Physical Exam    All of his incisions are clean without any evidence of infection Assessment:     Status post multiple lipoma excision    Plan:     He is doing well and he can return to full activity from my standpoint. His right foot surgery will limit him longer than mine. I told him that if he wants any more of these excised in the office just to let me know.

## 2012-08-24 ENCOUNTER — Other Ambulatory Visit: Payer: Self-pay

## 2013-05-15 ENCOUNTER — Other Ambulatory Visit: Payer: Self-pay

## 2013-10-08 ENCOUNTER — Encounter: Payer: Self-pay | Admitting: Podiatry

## 2013-10-08 ENCOUNTER — Ambulatory Visit (INDEPENDENT_AMBULATORY_CARE_PROVIDER_SITE_OTHER): Payer: BC Managed Care – PPO | Admitting: Podiatry

## 2013-10-08 VITALS — BP 137/92 | HR 64 | Resp 12

## 2013-10-08 DIAGNOSIS — L600 Ingrowing nail: Secondary | ICD-10-CM

## 2013-10-08 NOTE — Progress Notes (Signed)
   Subjective:    Patient ID: Juan Arroyo, male    DOB: [birth year], 58 y.o.   MRN: [id number]  HPI PT STATED RT FOOT GREAT TOENAIL IS SORE FOR 6 MONTHS. THE TOENAIL IS GETTING WORSE. THE TOENAIL GET AGGRAVATED BY PRESSURE AND SHOES. BUT TRIED NO TREATMENT.    Review of Systems  HENT: Positive for sinus pressure and sore throat.   All other systems reviewed and are negative.       Objective:   Physical Exam Orientated x3 white male  Vascular: DP and PT pulses 2/4 bilaterally  Neurological: Ankle reflexes reactive bilaterally  Dermatological: Well-healed surgical scars noted over the right first MPJ, second MPJ third MPJ fourth MPJ. The medialand lateral borders the right hallux nail are incurvated with callus nail grooves and palpable tenderness.  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       Assessment & Plan:   Assessment: Satisfactory neurovascular status Ingrowing medial and lateral borders of the right hallux toenail HAV deformity and hammertoes 2 through 5, left foot  Plan: Patient offered surgical correction of ingrowing toenails. He verbally consents to permanent removal of the margins.  The right hallux is then blocked with 3 cc of 50-50 mixture of 2% plain Xylocaine and 0.5% plain Marcaine. The right hallux is then painted with Betadine and exsanguinated. The medial and lateral borders the right hallux nail were excised and a phenol matricectomy performed to the medial lateral borders. An antibiotic dressing was applied. The tourniquet was released and spontaneous capillary filling time noted to the right hallux. Patient tolerated procedure without a complaint.  Postoperative oral written instructions provided. Over-the-counter pain medicine recommended for any pain control. Reappoint at patient's request.

## 2013-10-08 NOTE — Patient Instructions (Addendum)

## 2013-10-09 ENCOUNTER — Encounter: Payer: Self-pay | Admitting: Podiatry

## 2015-02-25 ENCOUNTER — Ambulatory Visit: Payer: Self-pay | Admitting: Sports Medicine

## 2015-03-01 ENCOUNTER — Ambulatory Visit (INDEPENDENT_AMBULATORY_CARE_PROVIDER_SITE_OTHER): Payer: BLUE CROSS/BLUE SHIELD | Admitting: Sports Medicine

## 2015-03-01 ENCOUNTER — Encounter: Payer: Self-pay | Admitting: Sports Medicine

## 2015-03-01 ENCOUNTER — Ambulatory Visit
Admission: RE | Admit: 2015-03-01 | Discharge: 2015-03-01 | Disposition: A | Discharge status: Home / Self Care | Payer: BLUE CROSS/BLUE SHIELD | Source: Ambulatory Visit | Attending: Sports Medicine | Admitting: Sports Medicine

## 2015-03-01 VITALS — BP 139/84 | HR 88 | Ht 76.0 in | Wt 215.0 lb

## 2015-03-01 DIAGNOSIS — M5441 Lumbago with sciatica, right side: Secondary | ICD-10-CM

## 2015-03-01 NOTE — Progress Notes (Signed)
Patient ID: Juan Arroyo, male   DOB: 09-19-1955, 59 y.o.   MRN: [id number]  HPI: [name] is a 59 year old male with a history of multiple R foot surgeries presenting with a few month history of R-sided lower back pain and bilateral centralized gluteal pain R>L.  Endorses back and buttock pain is worse on awakening, that improves as the day progresses. Reports one isolated episode of "lightening bolt" pain in posterior legs bilaterally with cessation at knee cap with flexion.  Back pain constant and worse with activity and notes that prolonged sitting worsens both his back pain and buttock pain.  Has been seeing [facility], who diagnosed him with piriformis syndrome; however, notes that his improvement has reached a plateau, as he still has lower back pain and bilateral gluteal pain (although less).  Takes two ibuprofen 200 mg daily for pain prophylaxis, and is unsure if beneficial.  Endorses "trouble" with bowel movements (doesn't sound like incontinence) and erectile dysfunction more recently. Denies paraesthesias.  Has history of R leg weakness and MRI notable for mild DDD and bulge at L3-4 in 2001, although he does not remember this.  Filed Vitals:   03/01/15 1429  BP: 139/84  Pulse: 88    Physical exam: General: Male in upper 50s sitting comfortable on exam table.  Trunk: Full ROM. Subjective worsening of R sided back pain with ~30 degrees of extension with pain elicited in L/R posterior thigh to knee. Tenderness to palpation paraspinally in mid-lumbar region on R.  Lower extremities: Full ROM.  5/5 strength hip add-/abductors, knee ext/flex, and plantar/dorsiflexion.  Tenderness to palpation in inner aspect of gluteal region bilaterally.  Sensation to light touch intact.  3+ patellar, 2+ Achilles reflexes bilaterally.  SLR negative bilaterally.  Assessment: [name] is a 59 year old male with a history of multiple R foot surgeries presenting with a few month  history of R-sided lower back pain and bilateral centralized gluteal pain R>L with negative SLR on exam and associated (possible) neurogenic bowel and erectile dysfunction.  Differential at this time includes lumbar radiculopathy 2/2 disc herniation, cauda equina syndrome, referred facet pain (has history of DDD), spinal stenosis.  [name] presentation is most concerning for lumbar radiculopathy 2/2 disc herniation due to prior imaging and reported, although SLR is negative. Cannot rule out cauda equina syndrome due to possible bowel and GU involvement.  Referred facet pain could be contributing as patient has discomfort with extension exercises. Less concerned for stenosis at this point due to absence of improvement with flexion.  Will proceed with immediate imaging due to duration of pain, radicular nature, and  possible bowel and GU involvement.  Plan:  - Will proceed with lumbar plain films and MRI.  Encouraged patient to do imaging as soon as possible due to possible bowel/GU involvement. - Discussed returning to a healthcare provider immediately if had worsening of symptoms or became incontinent.   - Can continue PT with [facility] with ibuprofen for pain management PRN - Follow-up once imaging results and will re-eval therapeutic plan at that time.  Bland Span, MS  Patient seen and evaluated with the above-named medical student. I agree with the plan of care. History is concerning for possible central lumbar disc herniation. The change in bowel movements is concerning. Therefore I'll proceed with x-rays and an MRI scan to evaluate further. I discussed the red flags symptoms of cauda equina with the patient and instructed him to seek treatment immediately in the emergency  room if these develop. He understands.

## 2015-03-06 ENCOUNTER — Ambulatory Visit
Admission: RE | Admit: 2015-03-06 | Discharge: 2015-03-06 | Disposition: A | Discharge status: Home / Self Care | Payer: BLUE CROSS/BLUE SHIELD | Source: Ambulatory Visit | Attending: Sports Medicine | Admitting: Sports Medicine

## 2015-03-06 DIAGNOSIS — M5441 Lumbago with sciatica, right side: Secondary | ICD-10-CM

## 2015-03-12 ENCOUNTER — Encounter: Payer: Self-pay | Admitting: Sports Medicine

## 2015-03-12 ENCOUNTER — Ambulatory Visit (INDEPENDENT_AMBULATORY_CARE_PROVIDER_SITE_OTHER): Payer: BLUE CROSS/BLUE SHIELD | Admitting: Sports Medicine

## 2015-03-12 VITALS — BP 129/91 | Ht 76.0 in | Wt 215.0 lb

## 2015-03-12 DIAGNOSIS — M545 Low back pain, unspecified: Secondary | ICD-10-CM

## 2015-03-12 NOTE — Progress Notes (Signed)
[facility] Aquilla Hacker, MD Phone: [phone]  Subjective:   # Follow Up Back Pain / MRI - Pt. Presented previously with low back pain, and during evaluation he mentioned that he was having difficulty with urination.  - MRI was ordered, and he returns for follow up. No emergent spinal compression.  - He continues to have pain in his low back with radiation from his gluteus down the back of his leg and stopping behind his knee, worse on the right. - He says this is worse whenever he bends over or sits.  - he has pain and symptoms with sitting, standing, or running.  - He does not get numbness or tingling below the knee, and he has no weakness.  - He says that he has pain with extension of his lumbar spine as well. - He has been taking ibuprofen / alleve for pain.  - he is already in physical therapy, and continues to do exercises for his pain.   All relevant systems were reviewed and were negative unless otherwise noted in the HPI  Past Medical History Reviewed problem list.  Medications- reviewed and updated Current Outpatient Prescriptions  Medication Sig Dispense Refill  . Ascorbic Acid (VITAMIN C) 1000 MG tablet Take 1,000 mg by mouth daily.    Marland Kitchen aspirin 81 MG tablet Take 81 mg by mouth daily.    . cetirizine (ZYRTEC) 10 MG tablet Take 10 mg by mouth daily.    . chlorpheniramine-HYDROcodone (TUSSIONEX) 10-8 MG/5ML LQCR     . losartan-hydrochlorothiazide (HYZAAR) 100-12.5 MG per tablet Take 1 tablet by mouth daily.    . magnesium oxide (MAG-OX) 400 MG tablet Take 400 mg by mouth daily.    . Multiple Vitamins-Minerals (MULTIVITAMIN WITH MINERALS) tablet Take 1 tablet by mouth daily.    . naproxen sodium (ANAPROX) 220 MG tablet Take 220 mg by mouth 2 (two) times daily with a meal.    . pravastatin (PRAVACHOL) 40 MG tablet Take 40 mg by mouth daily.     No current facility-administered medications for this visit.   Chief complaint-noted No additions  to family history  Objective: BP 129/91 mmHg  Ht 6\' 4"  (1.93 m)  Wt 215 lb (97.523 kg)  BMI 26.18 kg/m2 INSPECTION: no gross deformity, no swelling, erythema.  RANGE OF MOTION: Full ROM of L-spine with flexion and extension, though this was accompanied by pain more so in the extension component. FROM of BL lower extremities.  STRENGTH: 5/5 motor in all major muscle groups in bl lower extremities. Decent strength in his lumbar spinal muscle groups.  Neuro: 2+ patellar reflexes, equal BL, 2+ achiles equal bilaterally. NEUROVASCULAR STATUS: neurovascularly intact.   MRI L Spine:  IMPRESSION: 1. 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 There is resulting mild spinal stenosis at L3-4 and L4-5. 2. At L4-5, there is asymmetric disc bulging and endplate degeneration on the right contributing to moderate right foraminal narrowing and probable right L4 nerve root encroachment. 3. Milder right foraminal narrowing at L3-4 and left foraminal narrowing at L2-3 and L5-S1.   Assessment/Plan:  # Lumbar spinal canal stenosis L4, Foraminal Stenosis : pt. With continued symptoms. Needs ongoing PT, and will consider CS injection.  - Referral to IR for epidural injection L4 - Continue PT as he has been - Continue prn OTC pain medication.  - Needs to follow up with PCP regarding difficulty urinating.  - Will have him call back after injection to follow up  on symptoms.  - Will see him back as needed/ consider foraminal injection if little relief with the above plan.   Patient seen and evaluated with the resident. Agree with the above plan of care. Patient is reassured that I do not see any significant central disc herniation. He does have findings consistent with both foraminal stenosis and facet arthropathy on his MRI. I think the majority of his symptoms are due to his bulging disks. Therefore, I will refer him to [facility] imaging for a diagnostic/therapeutic lumbar  ESI. I've asked him to call me about a week after the injection for check on his progress. If he notices no symptom relief after the initial injection then we may want to try a facet injection. He should continue with the home exercise program that he learned in physical therapy. He can continue with his over-the-counter pain medication as needed also.

## 2015-03-16 ENCOUNTER — Other Ambulatory Visit: Payer: Self-pay | Admitting: Sports Medicine

## 2015-03-16 DIAGNOSIS — G8929 Other chronic pain: Secondary | ICD-10-CM

## 2015-03-16 DIAGNOSIS — M545 Low back pain: Principal | ICD-10-CM

## 2015-03-24 ENCOUNTER — Ambulatory Visit
Admission: RE | Admit: 2015-03-24 | Discharge: 2015-03-24 | Disposition: A | Discharge status: Home / Self Care | Payer: BLUE CROSS/BLUE SHIELD | Source: Ambulatory Visit | Attending: Sports Medicine | Admitting: Sports Medicine

## 2015-03-24 DIAGNOSIS — G8929 Other chronic pain: Secondary | ICD-10-CM

## 2015-03-24 DIAGNOSIS — M545 Low back pain: Principal | ICD-10-CM

## 2015-03-24 MED ORDER — IOHEXOL 180 MG/ML  SOLN
1.0000 mL | Freq: Once | INTRAMUSCULAR | Status: DC | PRN
  Administered 2015-03-24: 1 mL via EPIDURAL

## 2015-03-24 MED ORDER — METHYLPREDNISOLONE ACETATE 40 MG/ML INJ SUSP (RADIOLOG
120.0000 mg | Freq: Once | INTRAMUSCULAR | Status: AC
  Administered 2015-03-24: 120 mg via EPIDURAL

## 2015-03-24 NOTE — Discharge Instructions (Signed)

## 2015-05-03 ENCOUNTER — Encounter: Payer: Self-pay | Admitting: Sports Medicine

## 2015-05-06 ENCOUNTER — Other Ambulatory Visit: Payer: Self-pay | Admitting: *Deleted

## 2015-05-06 ENCOUNTER — Encounter: Payer: Self-pay | Admitting: *Deleted

## 2015-05-06 DIAGNOSIS — M545 Low back pain, unspecified: Secondary | ICD-10-CM

## 2015-05-14 ENCOUNTER — Other Ambulatory Visit: Payer: Self-pay | Admitting: Sports Medicine

## 2015-05-14 DIAGNOSIS — G8929 Other chronic pain: Secondary | ICD-10-CM

## 2015-05-14 DIAGNOSIS — M545 Low back pain: Principal | ICD-10-CM

## 2015-05-20 ENCOUNTER — Other Ambulatory Visit: Payer: BLUE CROSS/BLUE SHIELD

## 2016-10-21 IMAGING — MR MR LUMBAR SPINE W/O CM
5 series · 39 of 48 positions shown · non-contrast
Comparison: Lumbar spine radiographs 03/01/2015. Report only from
lumbar MRI 05/09/2000.

CLINICAL DATA: Acute onset of back pain 5 months ago with pain
extending to both legs, worse on the right. Associated bowel and
bladder symptoms. Underlying chronic back pain and piriformis
syndrome. No previous relevant surgery. Initial encounter.

EXAM:
MRI LUMBAR SPINE WITHOUT CONTRAST
TECHNIQUE: Multiplanar, multisequence MR imaging of the lumbar spine was
performed. No intravenous contrast was administered.

[Series 3: T2 · sagittal · 4.0mm · 0.88mm/px · 7 of 15 slices shown (1 of 2)]
[im 1/15]
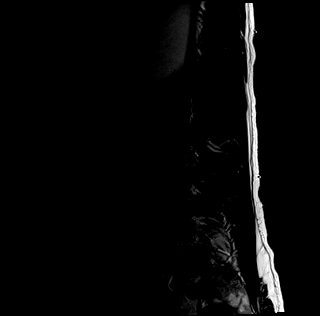
[im 3/15]
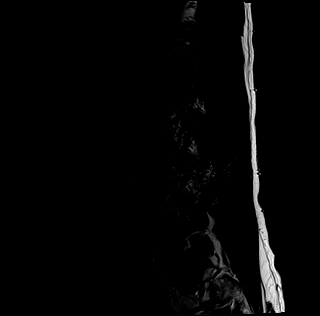
[im 5/15]
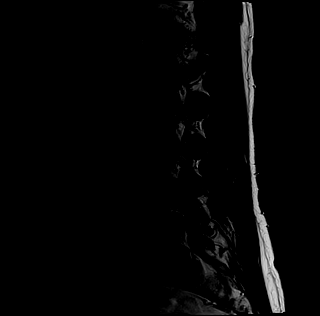
[im 8/15]
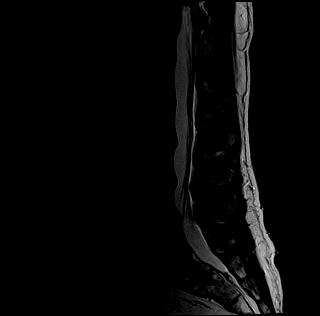
[im 10/15]
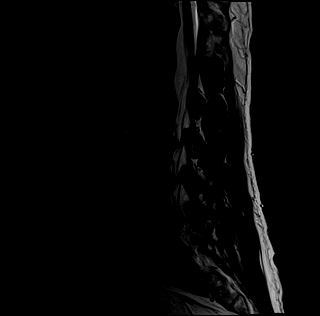
[im 12/15]
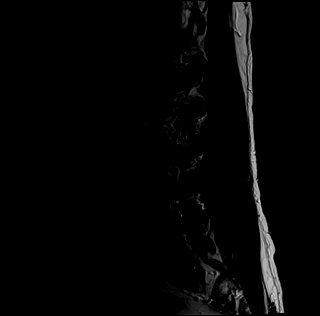
[im 15/15]
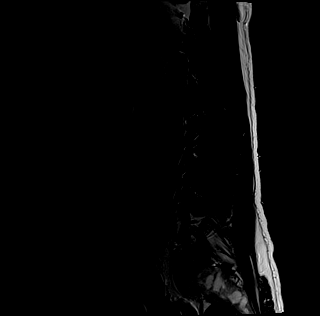

[Series 4: tirm sag · sagittal · 4.0mm · 0.55mm/px · 7 of 15 slices shown]
[im 1/15]
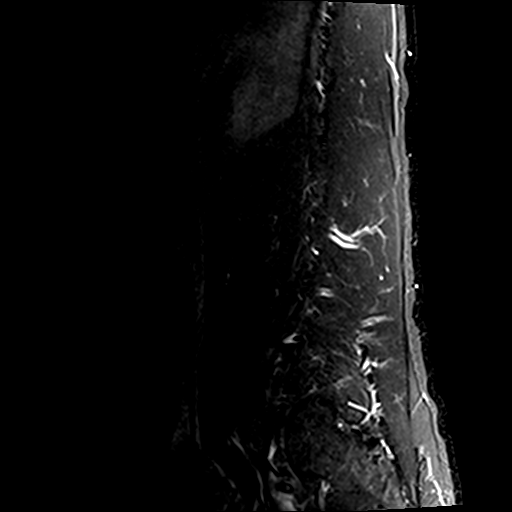
[im 3/15]
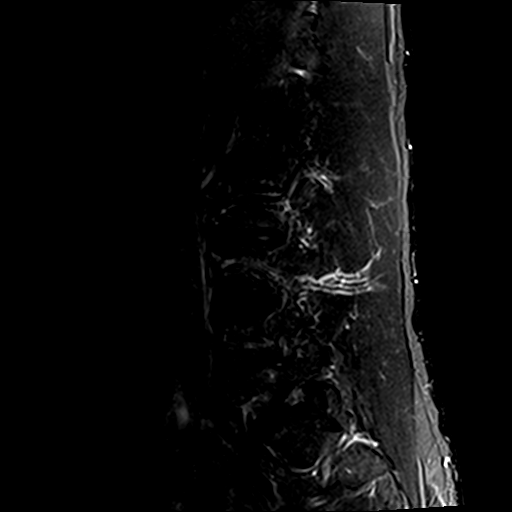
[im 5/15]
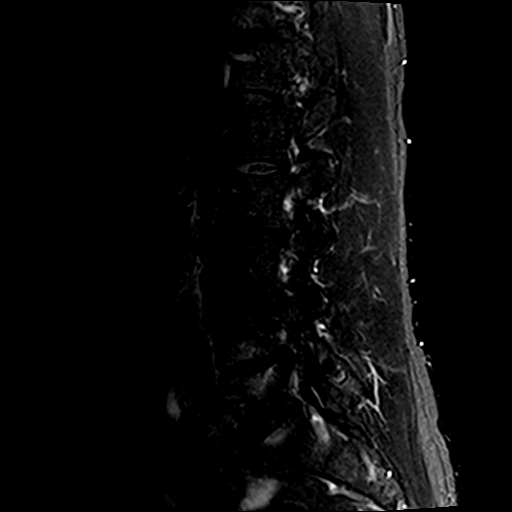
[im 8/15]
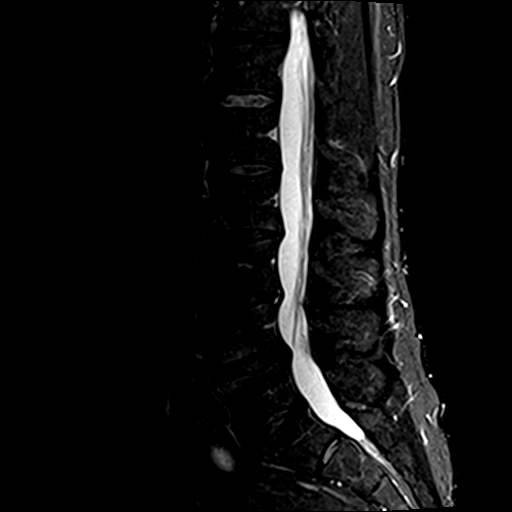
[im 10/15]
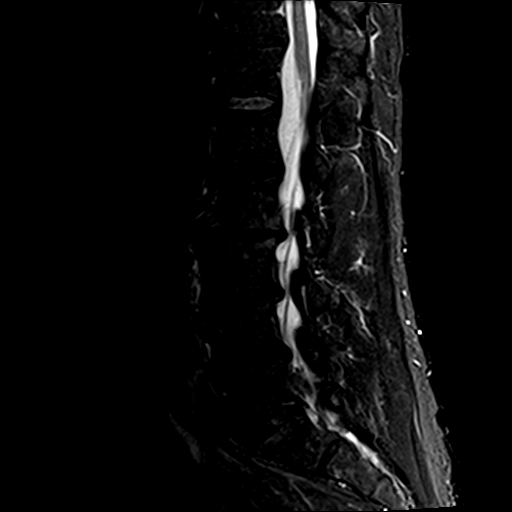
[im 12/15]
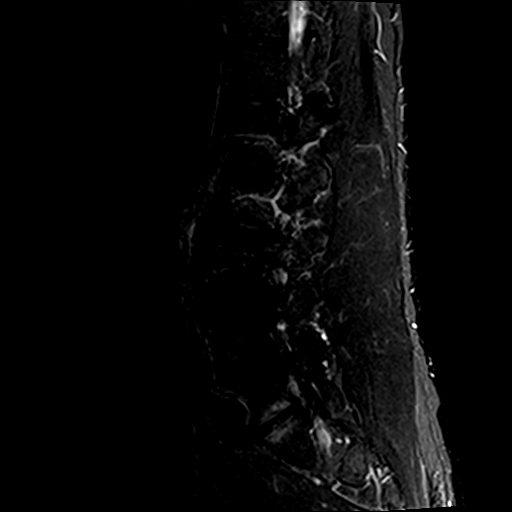
[im 15/15]
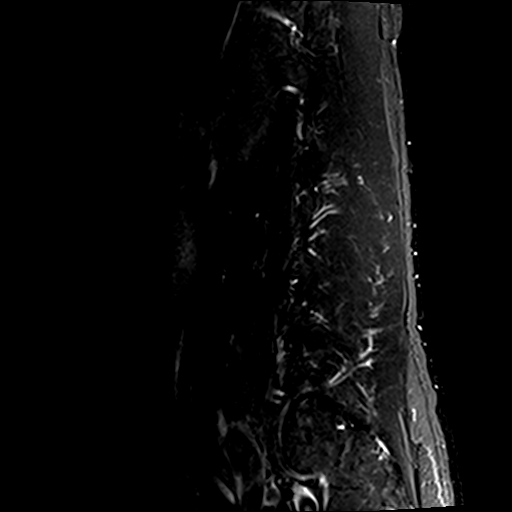

[Series 5: T1 · sagittal · 4.0mm · 0.88mm/px · 6 of 15 slices shown (1 of 2)]
[im 1/15]
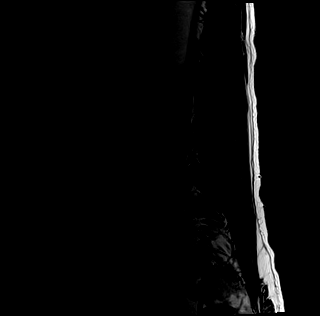
[im 3/15]
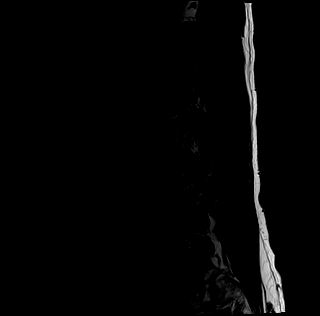
[im 6/15]
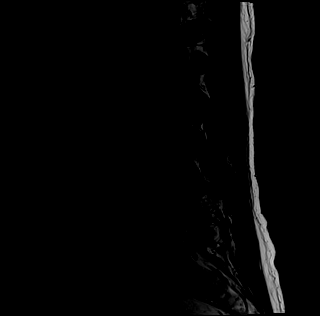
[im 9/15]
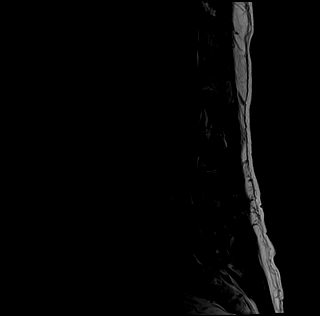
[im 12/15]
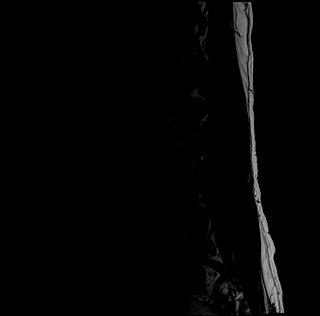
[im 15/15]
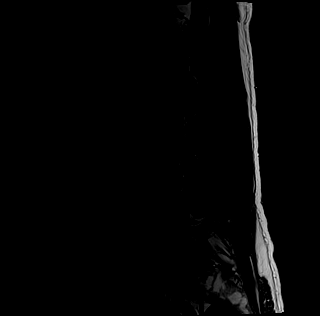

[Series 6: T1 · axial · 4.0mm · 0.70mm/px · z∈[-80,+107]mm · 8 of 34 slices shown (2 of 2)]
[im 1/34]
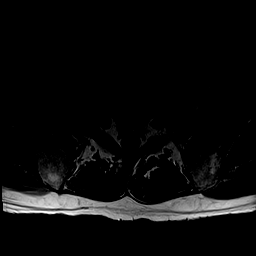
[im 6/34]
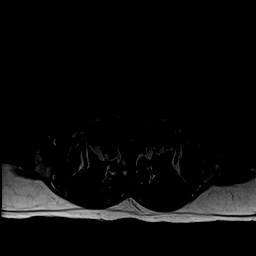
[im 11/34]
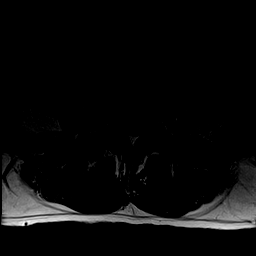
[im 16/34]
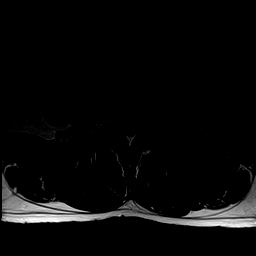
[im 18/34]
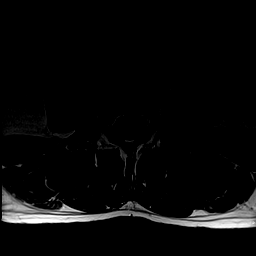
[im 23/34]
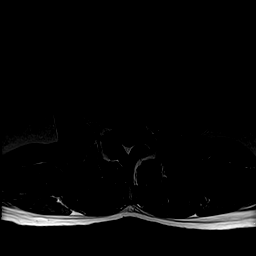
[im 28/34]
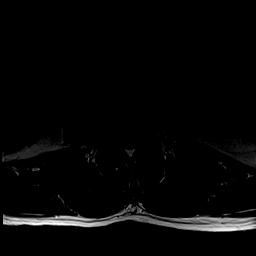
[im 34/34]
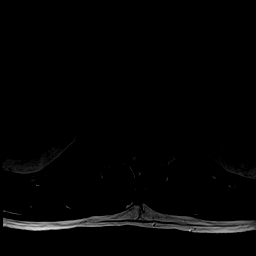

[Series 7: T2 · axial · 4.0mm · 0.70mm/px · z∈[-80,+107]mm · 11 of 34 slices shown (2 of 2)]
[im 1/34]
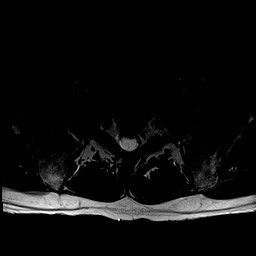
[im 3/34]
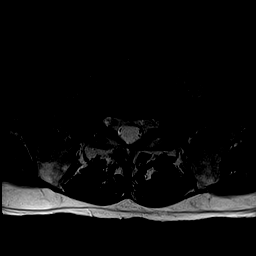
[im 6/34]
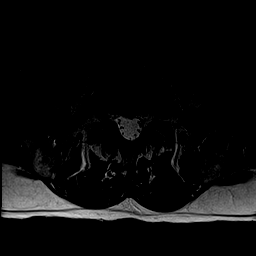
[im 8/34]
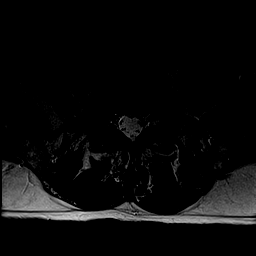
[im 11/34]
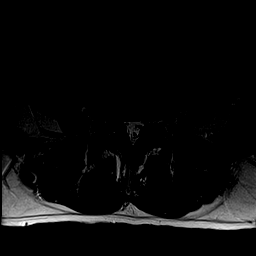
[im 13/34]
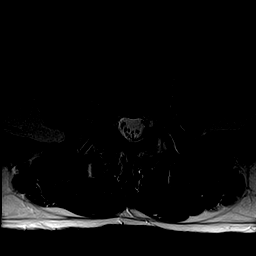
[im 16/34]
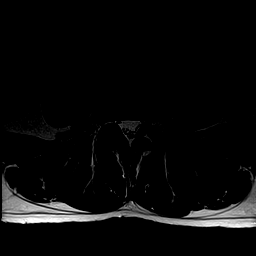
[im 18/34]
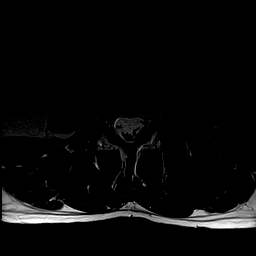
[im 23/34]
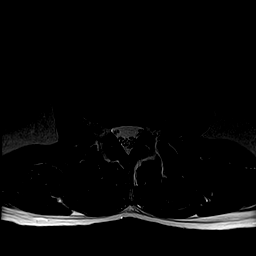
[im 28/34]
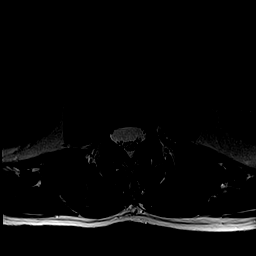
[im 34/34]
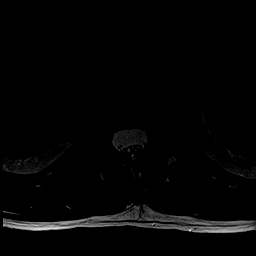

[39 of 48 positions shown; findings below may reference images not displayed]

FINDINGS: Radiographs demonstrate 5 lumbar type vertebral bodies. There is a
mild convex right scoliosis. No evidence of fracture or pars defect.
The lumbar pedicles are somewhat short on a congenital basis. There
are mild endplate degenerative changes, greatest at L4-5.

The conus medullaris extends to the L1 level and appears normal. No
paraspinal abnormalities are identified.

L1-2: Minimal disc bulging. No spinal stenosis or nerve root
encroachment.

L2-3: Moderate loss of disc height with annular disc bulging and
paraspinal osteophytes asymmetric to the left. There is mild
inferior foraminal narrowing on the left, but no definite nerve root
encroachment.

L3-4: Moderate loss of disc height with annular disc bulging, facet
and ligamentous hypertrophy contributing to mild spinal stenosis and
mild narrowing of the lateral recesses. There is also mild narrowing
of the foramina which appears worse on the right based on the axial
images. There is possible right L3 nerve root encroachment.

L4-5: There is loss of disc height with annular disc bulging and
endplate degeneration asymmetric to the right. There is moderate
facet and ligamentous hypertrophy. These factors contribute to mild
spinal stenosis with asymmetric moderate right foraminal narrowing.
Right L4 nerve root encroachment likely. There is mild narrowing of
the lateral recesses bilaterally.

L5-S1: Mild disc bulging and paraspinal osteophytes asymmetric to
the left. Mild bilateral facet hypertrophy. There is mild foraminal
narrowing on the left, but no definite nerve root encroachment.
IMPRESSION: 1. Multilevel spondylosis superimposed on a congenitally small
spinal canal. There is resulting mild spinal stenosis at L3-4 and
L4-5.
2. At L4-5, there is asymmetric disc bulging and endplate
degeneration on the right contributing to moderate right foraminal
narrowing and probable right L4 nerve root encroachment.
3. Milder right foraminal narrowing at L3-4 and left foraminal
narrowing at L2-3 and L5-S1.

## 2019-08-25 ENCOUNTER — Ambulatory Visit: Payer: BC Managed Care – PPO | Attending: Internal Medicine

## 2019-08-25 DIAGNOSIS — Z23 Encounter for immunization: Secondary | ICD-10-CM | POA: Insufficient documentation

## 2019-08-25 NOTE — Progress Notes (Signed)
   Covid-19 Vaccination Clinic  Name:  Juan Arroyo    MRN: [id number] DOB: [birth year]  08/25/2019  [name] was observed post Covid-19 immunization for 15 minutes without incidence. He was provided with Vaccine Information Sheet and instruction to access the V-Safe system.   [name] was instructed to call [phone] with any severe reactions post vaccine: Marland Kitchen Difficulty breathing  . Swelling of your face and throat  . A fast heartbeat  . A bad rash all over your body  . Dizziness and weakness    Immunizations Administered    Name Date Dose VIS Date Route   Pfizer COVID-19 Vaccine 08/25/2019  6:22 PM 0.3 mL 06/20/2019 Intramuscular   Manufacturer: [employer]   Lot: [id number]   [place]: [id number]

## 2019-09-16 ENCOUNTER — Ambulatory Visit: Payer: BC Managed Care – PPO | Attending: Internal Medicine

## 2019-09-16 DIAGNOSIS — Z23 Encounter for immunization: Secondary | ICD-10-CM | POA: Insufficient documentation

## 2019-09-16 NOTE — Progress Notes (Signed)
   Covid-19 Vaccination Clinic  Name:  Tiburcio Shirkey    MRN: [id number] DOB: May 20, 1956  09/16/2019  Mr. Hyer was observed post Covid-19 immunization for 15 minutes without incident. He was provided with Vaccine Information Sheet and instruction to access the V-Safe system.   Mr. Bernheim was instructed to call 911 with any severe reactions post vaccine: Marland Kitchen Difficulty breathing  . Swelling of face and throat  . A fast heartbeat  . A bad rash all over body  . Dizziness and weakness   Immunizations Administered    Name Date Dose VIS Date Route   Pfizer COVID-19 Vaccine 09/16/2019  2:47 PM 0.3 mL 06/20/2019 Intramuscular   Manufacturer: [place]   Lot: [id number]   [place]: [id number]

## 2019-09-17 ENCOUNTER — Ambulatory Visit: Payer: BC Managed Care – PPO

## 2020-09-09 ENCOUNTER — Non-Acute Institutional Stay (SKILLED_NURSING_FACILITY): Payer: Medicare Other | Admitting: Internal Medicine

## 2020-09-09 ENCOUNTER — Encounter: Payer: Self-pay | Admitting: Internal Medicine

## 2020-09-09 DIAGNOSIS — E785 Hyperlipidemia, unspecified: Secondary | ICD-10-CM | POA: Diagnosis not present

## 2020-09-09 DIAGNOSIS — G629 Polyneuropathy, unspecified: Secondary | ICD-10-CM | POA: Diagnosis not present

## 2020-09-09 DIAGNOSIS — M545 Low back pain, unspecified: Secondary | ICD-10-CM | POA: Diagnosis not present

## 2020-09-09 DIAGNOSIS — G300 Alzheimer's disease with early onset: Secondary | ICD-10-CM

## 2020-09-09 DIAGNOSIS — F339 Major depressive disorder, recurrent, unspecified: Secondary | ICD-10-CM

## 2020-09-09 DIAGNOSIS — I10 Essential (primary) hypertension: Secondary | ICD-10-CM | POA: Diagnosis not present

## 2020-09-09 DIAGNOSIS — F0280 Dementia in other diseases classified elsewhere without behavioral disturbance: Secondary | ICD-10-CM

## 2020-09-09 DIAGNOSIS — G8929 Other chronic pain: Secondary | ICD-10-CM

## 2020-09-09 NOTE — Progress Notes (Signed)
Provider:  Veleta Miners MD Location:    [facility] Room Number: 8 Place of Service:  SNF (31)  PCP: Stephens Shire, MD Patient Care Team: Stephens Shire, MD as PCP - General [facility] Medicine)  Extended Emergency Contact Information Primary Emergency Contact: Theurer,Donna Address: [address]          [place], [place] [address] Johnnette Litter of [place] Phone: [phone] Mobile Phone: [phone] Relation: Spouse  Code Status: DNR Goals of Care: Advanced Directive information Advanced Directives 03/12/2015  Does Patient Have a Medical Advance Directive? No  Would patient like information on creating a medical advance directive? No - patient declined information      Chief Complaint  Patient presents with  . New Admit To SNF    Admission to SNF    HPI: Patient is a 65 y.o. male seen today for admission to SNF for long-term care  Patient has a history of Alzheimer's dementia without behavioral disturbance.  Has had extensive work-up in [facility]. Was diagnosed at age of 50.  Had MRI done which showed severe atrophy and small vessel ischemia.  Has been on Aricept and Namenda. His wife who is the main caregiver has moved him here due to his underlying physical decline and cognitive decline Patient is unable to give me any history Was not sure where he is or why he is here  Has history of chronic back pain MRI has shown lumbar stenosis lat L3-4, L4-5. Prior L2 compression fracture On high doses of Lyrica and started on Butrans per Neurology  Clawing of toes Per Neurology due to  ? neuropathy    Past Medical History:  Diagnosis Date  . Hyperlipidemia   . Hypertension   . Seasonal allergies    Past Surgical History:  Procedure Laterality Date  . HAMMERTOE RECONSTRUCTION WITH WEIL OSTEOTOMY  08/01/2012   Procedure: HAMMERTOE RECONSTRUCTION WITH WEIL OSTEOTOMY;  Surgeon: Wylene Simmer, MD;  Location: [facility];   Service: Orthopedics;  Laterality: Right;  RIGHT SCARF OSTEOTOMY, MODIFIED MCBRIDE BUNIONECTOMY, , RIGHT 2ND - 4TH WEIL, RIGHT 2ND - 5TH DORSAL CAPSULOTOMIES, RIGHT 2ND - 4TH PIP ARTHRODESIS  . LIPOMA EXCISION  08/01/2012   Procedure: EXCISION LIPOMA;  Surgeon: Rolm Bookbinder, MD;  Location: [facility];  Service: General;  Laterality: N/A;  EXCISION LIPOMAS X 6 (LEFT TRICEP, RIGHT TRICEP, DELTOID, BIL ABDOMINAL WALL AND LEFT THIGH)  . NASAL SINUS SURGERY    . SHOULDER SURGERY     right    reports that he has never smoked. He does not have any smokeless tobacco history on file. He reports current alcohol use. He reports that he does not use drugs. Social History   Socioeconomic History  . Marital status: Married    Spouse name: Not on file  . Number of children: Not on file  . Years of education: Not on file  . Highest education level: Not on file  Occupational History  . Not on file  Tobacco Use  . Smoking status: Never Smoker  . Smokeless tobacco: Not on file  Substance and Sexual Activity  . Alcohol use: Yes    Comment: occ  . Drug use: No  . Sexual activity: Not on file  Other Topics Concern  . Not on file  Social History Narrative  . Not on file   Social Determinants of Health   Financial Resource Strain: Not on file  Food Insecurity: Not on file  Transportation  Needs: Not on file  Physical Activity: Not on file  Stress: Not on file  Social Connections: Not on file  Intimate Partner Violence: Not on file    Functional Status Survey:    History reviewed. No pertinent family history.  Health Maintenance  Topic Date Due  . Hepatitis C Screening  Never done  . HIV Screening  Never done  . COLONOSCOPY (Pts 45-47yrs Insurance coverage will need to be confirmed)  Never done  . PNA vac Low Risk Adult (1 of 2 - PCV13) Never done  . TETANUS/TDAP  08/01/2020  . INFLUENZA VACCINE  Completed  . COVID-19 Vaccine  Completed  . HPV VACCINES  Aged Out     No Known Allergies  Allergies as of 09/09/2020   No Known Allergies     Medication List       Accurate as of September 09, 2020  4:15 PM. If you have any questions, ask your nurse or doctor.        STOP taking these medications   aspirin 81 MG tablet Stopped by: Virgie Dad, MD   chlorpheniramine-HYDROcodone 10-8 MG/5ML Lqcr Commonly known as: TUSSIONEX Stopped by: Virgie Dad, MD   magnesium oxide 400 MG tablet Commonly known as: MAG-OX Stopped by: Virgie Dad, MD   multivitamin with minerals tablet Stopped by: Virgie Dad, MD   naproxen sodium 220 MG tablet Commonly known as: ALEVE Stopped by: Virgie Dad, MD   vitamin C 1000 MG tablet Stopped by: Virgie Dad, MD     TAKE these medications   buprenorphine 10 MCG/HR Ptwk Commonly known as: BUTRANS Place 1 patch onto the skin once a week.   cetirizine 10 MG tablet Commonly known as: ZYRTEC Take 10 mg by mouth daily.   donepezil 10 MG tablet Commonly known as: ARICEPT Take 20 mg by mouth at bedtime.   DULoxetine 60 MG capsule Commonly known as: CYMBALTA Take 60 mg by mouth 2 (two) times daily.   losartan-hydrochlorothiazide 100-12.5 MG tablet Commonly known as: HYZAAR Take 1 tablet by mouth daily.   meloxicam 7.5 MG tablet Commonly known as: MOBIC Take 7.5 mg by mouth daily.   memantine 10 MG tablet Commonly known as: NAMENDA Take 10 mg by mouth 2 (two) times daily.   pravastatin 40 MG tablet Commonly known as: PRAVACHOL Take 40 mg by mouth daily.   pregabalin 100 MG capsule Commonly known as: LYRICA Take 100 mg by mouth 2 (two) times daily.   tamsulosin 0.4 MG Caps capsule Commonly known as: FLOMAX Take 0.4 mg by mouth daily.   Tubersol 5 UNIT/0.1ML injection Generic drug: tuberculin Inject 5 Units into the skin. Once A Day Every 14 Days   zinc oxide 20 % ointment Apply 1 application topically as needed for irritation.       Review of Systems  Unable to perform  ROS: Dementia      Vitals:   09/09/20 1604  BP: (!) 142/95  Pulse: 78  Resp: 17  Temp: 97.8 F (36.6 C)  SpO2: 94%  Weight: 210 lb (95.3 kg)  Height: 6\' 4"  (1.93 m)   Body mass index is 25.56 kg/m. Physical Exam  Constitutional: . Well-developed and well-nourished.  HENT:  Head: Normocephalic.  Mouth/Throat: Oropharynx is clear and moist.  Eyes: Pupils are equal, round, and reactive to light.  Neck: Neck supple.  Cardiovascular: Normal rate and normal heart sounds.  No murmur heard. Pulmonary/Chest: Effort normal and breath sounds normal. No  respiratory distress. No wheezes. She has no rales.  Abdominal: Soft. Bowel sounds are normal. No distension. There is no tenderness. There is no rebound.  Musculoskeletal: No edema.  Lymphadenopathy: none Neurological: No Focal deficits Alert but not oriented. Dis not know his DOB or age Unable to name objects Skin: Skin is warm and dry.  Psychiatric: Normal mood and affect. Behavior is normal. Thought content normal.    Labs reviewed: Basic Metabolic Panel: No results for input(s): NA, K, CL, CO2, GLUCOSE, BUN, CREATININE, CALCIUM, MG, PHOS in the last 8760 hours. Liver Function Tests: No results for input(s): AST, ALT, ALKPHOS, BILITOT, PROT, ALBUMIN in the last 8760 hours. No results for input(s): LIPASE, AMYLASE in the last 8760 hours. No results for input(s): AMMONIA in the last 8760 hours. CBC: No results for input(s): WBC, NEUTROABS, HGB, HCT, MCV, PLT in the last 8760 hours. Cardiac Enzymes: No results for input(s): CKTOTAL, CKMB, CKMBINDEX, TROPONINI in the last 8760 hours. BNP: Invalid input(s): POCBNP No results found for: HGBA1C No results found for: TSH No results found for: VITAMINB12 No results found for: FOLATE No results found for: IRON, TIBC, FERRITIN  Imaging and Procedures obtained prior to SNF admission: DG Epidurography  Result Date: 03/24/2015 CLINICAL DATA:  Spondylosis without myelopathy. Right  low back, buttock and thigh pain. Symptoms began while bending over several weeks ago. EXAM: LUMBAR EPIDURAL INJECTION DIAGNOSTIC EPIDURAL INJECTION THERAPEUTIC EPIDURAL INJECTION COMPARISON:  MRI 03/06/2015 PROCEDURE: The procedure, risks, benefits, and alternatives were explained to the patient. Questions regarding the procedure were encouraged and answered. The patient understands and consents to the procedure. LUMBAR EPIDURAL INJECTION An interlaminar approach was performed on the right at L4-5. The overlying skin was cleansed with Betadine, draped in sterile fashion, and anesthetized using 1% Lidocaine. A 20 gauge spinal needle was advanced using loss-of-resistance technique. DIAGNOSTICEPIDURAL INJECTION Injection of Omnipaque 180 shows a good epidural pattern with spread above and below the level of needle placement, primarily on the right. No vascular opacification is seen. Pattern of spread seems normal. THERAPEUTICEPIDURAL INJECTION One hundred twentymg of Depo-Medrol mixed with 2.5 cc 1% lidocaine were instilled. The procedure was well-tolerated, and the patient was discharged thirty minutes following the injection in good condition. FLUOROSCOPY TIME:  0 minutes 23 seconds.  0.079 Gy cm squared IMPRESSION: Technically successful initial epidural injection on the right at L4-5. Electronically Signed   By: Nelson Chimes M.D.   On: 03/24/2015 14:55    Assessment/Plan Essential hypertension On Hyzaar Repeat BMP  Chronic bilateral low back pain without sciatica On Lyrica, Butrans and Meloxicam Will work with therapy  Hyperlipidemia, unspecified hyperlipidemia type On Statin Repeat Lipid Panel Neuropathy Continue Lyrica  Early onset Alzheimer's dementia without behavioral disturbance ([place]) On High dose of Aricept and Namenda Depression, recurrent ([place]) Continue Cymbalta ACP Is DNR and Limited Intervention per Wife Wants Palliative care   Family/ staff Communication:   Labs/tests  ordered: CBC,CMP,TSH Lipid Panel

## 2020-09-13 LAB — COMPREHENSIVE METABOLIC PANEL
Albumin: 3.9 (ref 3.5–5.0)
Calcium: 9.4 (ref 8.7–10.7)
Globulin: 1.7

## 2020-09-13 LAB — BASIC METABOLIC PANEL
BUN: 21 (ref 4–21)
CO2: 29 — AB (ref 13–22)
Chloride: 108 (ref 99–108)
Creatinine: 1.2 (ref 0.6–1.3)
Glucose: 83
Potassium: 3.8 (ref 3.4–5.3)
Sodium: 143 (ref 137–147)

## 2020-09-13 LAB — CBC AND DIFFERENTIAL
HCT: 46 (ref 41–53)
Hemoglobin: 15.5 (ref 13.5–17.5)
Neutrophils Absolute: 4264
Platelets: 246 (ref 150–399)
WBC: 6.5

## 2020-09-13 LAB — HEPATIC FUNCTION PANEL
ALT: 41 — AB (ref 10–40)
AST: 24 (ref 14–40)
Alkaline Phosphatase: 65 (ref 25–125)
Bilirubin, Total: 1.4

## 2020-09-13 LAB — LIPID PANEL
Cholesterol: 177 (ref 0–200)
HDL: 45 (ref 35–70)
LDL Cholesterol: 109
LDl/HDL Ratio: 3.9

## 2020-09-13 LAB — TSH: TSH: 1.46 (ref 0.41–5.90)

## 2020-09-13 LAB — CBC: RBC: 5.19 — AB (ref 3.87–5.11)

## 2020-09-14 ENCOUNTER — Other Ambulatory Visit: Payer: Self-pay

## 2020-09-14 ENCOUNTER — Non-Acute Institutional Stay: Payer: Self-pay | Admitting: Student

## 2020-09-14 ENCOUNTER — Encounter: Payer: Self-pay | Admitting: Nurse Practitioner

## 2020-09-14 DIAGNOSIS — E785 Hyperlipidemia, unspecified: Secondary | ICD-10-CM | POA: Insufficient documentation

## 2020-09-14 DIAGNOSIS — Z515 Encounter for palliative care: Secondary | ICD-10-CM

## 2020-09-14 NOTE — Progress Notes (Signed)
[facility] Consult Note Telephone: [phone]  Fax: [phone]  PATIENT NAME: Juan Arroyo [address] [place] [address] [phone] (home) [phone] (work) DOB: [birth year] MRN: [id number]  PRIMARY CARE PROVIDER:    Stephens Shire, MD,  [address] [place] [address] [phone]  REFERRING PROVIDER:   Dr. Veleta Miners   RESPONSIBLE PARTY:   Extended Emergency Contact Information Primary Emergency Contact: Bejarano,Donna Address: [address]          [place], [place] [address] Johnnette Litter of [place] Phone: [phone] Mobile Phone: [phone] Relation: Spouse  I met face to face with patient and family in facility.   ASSESSMENT AND RECOMMENDATIONS:   Advance Care Planning: Visit at the request of Dr. Lyndel Safe for palliative consult. Visit consisted of building trust and discussions on Palliative care medicine as specialized medical care for people living with serious illness, aimed at facilitating improved quality of life through symptoms relief, assisting with advance care planning and establishing goals of care. Education provided on Palliative Medicine vs. Hospice services. Reviewed MOST form reviewed today; no changes. Palliative care will continue to provide support to patient, family and the medical team.  Goal of care: For patient to be comfortable, pain managed.   Directives: MOST form, DNR.  Symptom Management:   Pain-patient with chronic low back pain. Butrans patch recently increased to 52mg/hr. Recommend: continue buprenophrine patch159m/hr, change weekly on Fridays, duloxetine 6038maily, meloxicam 7.5mg72mily, Lyrica 100mg33m. Start acetaminophen 1000mg 72m  memantine 10mg. 39minue PT as able to tolerate. Staff to assist with adl's.   Alzheimer's dementia-FAST score 6D. Patient requires assistance with adl's. Recommend: reorient/redirect as needed. Monitor  for safety and falls, wander guard in place. PT/OT/ST as directed. Continue memantine 10mg da60m donepezil 20mg dai44m    Follow up Palliative Care Visit: Palliative care will continue to follow for complex decision making and symptom management. Return in 4 weeks or prn.  Family /Caregiver/Community Supports: Palliative Medicine will continue to provide support to patient and family.    I spent 45 minutes providing this consultation, from 11:00am to 11:45am. Time includes time spent with patient/family, chart review, provider coordination, and documentation. More than 50% of the time in this consultation was spent counseling and coordinating communication.   CHIEF COMPLAINT: Palliative Medicine initial consult, pain.   History obtained from review of EMR, discussion with primary team, and  interview with family. Records reviewed and summarized bellow.  HISTORY OF PRESENT ILLNESS:  FrederickAbdinasir Spadaforey.o. y60r old male with multiple medical problems including Alzheimer's Dementia, chronic low back pain, hypertension, hyperlipidemia. Palliative Care was asked to follow this patient by consultation request of Dr. Gupta to Lyndel Safeaddress advance care planning and goals of care. This is an initial visit.  [facility] had been caring for patient at home previously. Reports mild, achy back pain today. Butrans patch increased to 10mcg/hr 70m Friday. Also receiving meloxicam 7.5mg daily,2muloxetine 60mg daily 38mpain management. He had been on Lyrica 100mg TID, bu18mth increased somnolence and wife reports no improvement in pain at higher dose. Currently down to lyrica 100mg BID. Pat26m receives assistance with adl's. Staff report LE weakness, difficulty ambulating, poor safety awareness. Has orders for OT/PT/ST. Good appetite reported, regular diet, thin liquids. Sleeping well. Wife states patient has receiving epidural steroid injections without improvement in  pain.  Wander guard now in  place due to wandering. Wife plans to cancel upcoming neurology and surgical consult appointments; wife expresses much difficulty in transferring patient to/from appointments.   CODE STATUS: DNR  PPS: 50%  HOSPICE ELIGIBILITY/DIAGNOSIS: TBD  ROS   General: NAD EYES: denies vision changes, wears glasses ENMT: denies dysphagia Cardiovascular: denies chest pain Pulmonary: denies cough, denies increased SOB Abdomen: good appetite, denies constipation GU: denies dysuria, occasional urinary incontinence MSK:  LE weakness, falls reported Skin: denies rashes or wounds Neurological: endorses weakness, sleeping well Psych: Endorses positive mood, wanders Heme/lymph/immuno: denies bruises, abnormal bleeding   Physical Exam: Pulse 80, resp 16, b/p 120/80 sats 95% on room air Current weight: 210 pounds Constitutional: NAD General: alert oriented to person, familiars, forgetfulness EYES: anicteric sclera, lids intact, no discharge  ENMT: intact hearing,oral mucous membranes moist CV: RRR,  no LE edema Pulmonary: LCTA,  no increased work of breathing, no cough, room air Abdomen: abd soft, non-tender, bowel sounds normoactive x 4 GU: deferred MSK: LE weakness, unsteady gait Skin: warm and dry, no rashes or wounds on visible skin Neuro: Generalized weakness Psych: non anxious affect today Hem/lymph/immuno: no widespread bruising   PAST MEDICAL HISTORY:  Past Medical History:  Diagnosis Date  . Hyperlipidemia   . Hypertension   . Seasonal allergies     SOCIAL HX:  Social History   Tobacco Use  . Smoking status: Never Smoker  . Smokeless tobacco: Not on file  Substance Use Topics  . Alcohol use: Yes    Comment: occ   FAMILY HX: No family history on file.  ALLERGIES: No Known Allergies   PERTINENT MEDICATIONS:  Outpatient Encounter Medications as of 09/14/2020  Medication Sig  . buprenorphine (BUTRANS) 10 MCG/HR PTWK Place 1 patch onto the skin once  a week.  . cetirizine (ZYRTEC) 10 MG tablet Take 10 mg by mouth daily.  Marland Kitchen donepezil (ARICEPT) 10 MG tablet Take 20 mg by mouth at bedtime.  . DULoxetine (CYMBALTA) 60 MG capsule Take 60 mg by mouth 2 (two) times daily.  Marland Kitchen losartan-hydrochlorothiazide (HYZAAR) 100-12.5 MG per tablet Take 1 tablet by mouth daily.  . meloxicam (MOBIC) 7.5 MG tablet Take 7.5 mg by mouth daily.  . memantine (NAMENDA) 10 MG tablet Take 10 mg by mouth 2 (two) times daily.  . pravastatin (PRAVACHOL) 40 MG tablet Take 40 mg by mouth daily.  . pregabalin (LYRICA) 100 MG capsule Take 100 mg by mouth 2 (two) times daily.  . tamsulosin (FLOMAX) 0.4 MG CAPS capsule Take 0.4 mg by mouth daily.  Marland Kitchen tuberculin (TUBERSOL) 5 UNIT/0.1ML injection Inject 5 Units into the skin. Once A Day Every 14 Days  . zinc oxide 20 % ointment Apply 1 application topically as needed for irritation.   No facility-administered encounter medications on file as of 09/14/2020.     Thank you for the opportunity to participate in the care of Mr. Matty. The palliative care team will continue to follow. Please call our office at [phone] if we can be of additional assistance.  Charlann Boxer, AGPCNP-C

## 2020-10-07 ENCOUNTER — Encounter: Payer: Self-pay | Admitting: Internal Medicine

## 2020-10-07 ENCOUNTER — Non-Acute Institutional Stay (SKILLED_NURSING_FACILITY): Payer: Medicare Other | Admitting: Internal Medicine

## 2020-10-07 DIAGNOSIS — G300 Alzheimer's disease with early onset: Secondary | ICD-10-CM | POA: Diagnosis not present

## 2020-10-07 DIAGNOSIS — F0281 Dementia in other diseases classified elsewhere with behavioral disturbance: Secondary | ICD-10-CM

## 2020-10-07 DIAGNOSIS — M545 Low back pain, unspecified: Secondary | ICD-10-CM

## 2020-10-07 DIAGNOSIS — E785 Hyperlipidemia, unspecified: Secondary | ICD-10-CM

## 2020-10-07 DIAGNOSIS — G629 Polyneuropathy, unspecified: Secondary | ICD-10-CM

## 2020-10-07 DIAGNOSIS — F339 Major depressive disorder, recurrent, unspecified: Secondary | ICD-10-CM | POA: Diagnosis not present

## 2020-10-07 DIAGNOSIS — I10 Essential (primary) hypertension: Secondary | ICD-10-CM | POA: Diagnosis not present

## 2020-10-07 DIAGNOSIS — G8929 Other chronic pain: Secondary | ICD-10-CM

## 2020-10-07 NOTE — Progress Notes (Signed)
Location: [place] Room Number: 8 Place of Service:  SNF (31)  Provider:   Code Status:  Goals of Care:  Advanced Directives 10/08/2020  Does Patient Have a Medical Advance Directive? Yes  Type of Paramedic of [place];Living will;Out of facility DNR (pink MOST or yellow form)  Does patient want to make changes to medical advance directive? No - Patient declined  Copy of [place] in Chart? Yes - validated most recent copy scanned in chart (See row information)  Would patient like information on creating a medical advance directive? -  Pre-existing out of facility DNR order (yellow form or pink MOST form) Yellow form placed in chart (order not valid for inpatient use);Pink MOST form placed in chart (order not valid for inpatient use)     Chief Complaint  Patient presents with  . Acute Visit    HPI: Patient is a 65 y.o. male seen today for an acute visit for Behaviors issues especially during Evening  Patient has a history of Alzheimer's dementia without behavioral disturbance.  Has had extensive work-up in [facility]. Was diagnosed at age of 79.  Had MRI done which showed severe atrophy and small vessel ischemia.  Has been on Aricept and Namenda. His wife who is the main caregiver has moved him here due to his underlying physical decline and cognitive decline Has history of chronic back pain MRI has shown lumbar stenosis lat L3-4, L4-5. Prior L2 compression fracture On high doses of Lyrica and started on Butrans per Neurology   Patient is unable to give me any history Discussed with the wife on the phone  Patient gets agitated specially during the evening time sometimes during the daytime..  She has noticed that he is sometimes crying Tries to walk without his walker.  Tries to exit the facility. Stays at very high risk of falls. The wife wants to know if there is something can be done to help him as needed  for his behaviors and anxiety.  Past Medical History:  Diagnosis Date  . Hyperlipidemia   . Hypertension   . Seasonal allergies     Past Surgical History:  Procedure Laterality Date  . HAMMERTOE RECONSTRUCTION WITH WEIL OSTEOTOMY  08/01/2012   Procedure: HAMMERTOE RECONSTRUCTION WITH WEIL OSTEOTOMY;  Surgeon: Wylene Simmer, MD;  Location: [place];  Service: Orthopedics;  Laterality: Right;  RIGHT SCARF OSTEOTOMY, MODIFIED MCBRIDE BUNIONECTOMY, , RIGHT 2ND - 4TH WEIL, RIGHT 2ND - 5TH DORSAL CAPSULOTOMIES, RIGHT 2ND - 4TH PIP ARTHRODESIS  . LIPOMA EXCISION  08/01/2012   Procedure: EXCISION LIPOMA;  Surgeon: Rolm Bookbinder, MD;  Location: [place];  Service: General;  Laterality: N/A;  EXCISION LIPOMAS X 6 (LEFT TRICEP, RIGHT TRICEP, DELTOID, BIL ABDOMINAL WALL AND LEFT THIGH)  . NASAL SINUS SURGERY    . SHOULDER SURGERY     right    No Known Allergies  Outpatient Encounter Medications as of 10/07/2020  Medication Sig  . acetaminophen (TYLENOL) 500 MG tablet Take 1,000 mg by mouth 3 (three) times daily.  . buprenorphine (BUTRANS) 10 MCG/HR PTWK Place 1 patch onto the skin once a week.  . cetirizine (ZYRTEC) 10 MG tablet Take 10 mg by mouth daily.  Marland Kitchen donepezil (ARICEPT) 10 MG tablet Take 20 mg by mouth every morning.  . DULoxetine (CYMBALTA) 60 MG capsule Take 60 mg by mouth 2 (two) times daily.  Marland Kitchen LORazepam (ATIVAN) 0.5 MG tablet Take 0.5 mg  by mouth 2 (two) times daily as needed for anxiety.  Marland Kitchen losartan-hydrochlorothiazide (HYZAAR) 100-12.5 MG per tablet Take 1 tablet by mouth daily.  . meloxicam (MOBIC) 7.5 MG tablet Take 7.5 mg by mouth daily.  . memantine (NAMENDA) 10 MG tablet Take 10 mg by mouth 2 (two) times daily.  . pravastatin (PRAVACHOL) 40 MG tablet Take 40 mg by mouth daily.  . pregabalin (LYRICA) 100 MG capsule Take 100 mg by mouth 2 (two) times daily.  . tamsulosin (FLOMAX) 0.4 MG CAPS capsule Take 0.4 mg by mouth daily.  Marland Kitchen zinc oxide  20 % ointment Apply 1 application topically as needed for irritation.   No facility-administered encounter medications on file as of 10/07/2020.    Review of Systems:  Review of Systems  Unable to give much history due to dementia He says I am fine talk to my wife  Health Maintenance  Topic Date Due  . Hepatitis C Screening  Never done  . HIV Screening  Never done  . COLONOSCOPY (Pts 45-4yrs Insurance coverage will need to be confirmed)  Never done  . PNA vac Low Risk Adult (1 of 2 - PCV13) Never done  . TETANUS/TDAP  08/01/2020  . INFLUENZA VACCINE  02/07/2021  . COVID-19 Vaccine  Completed  . HPV VACCINES  Aged Out    Physical Exam: Vitals:   10/07/20 1340  BP: 127/68  Pulse: 74  Resp: 18  Temp: 98 F (36.7 C)  SpO2: 96%  Weight: 215 lb (97.5 kg)  Height: 6\' 4"  (1.93 m)   Body mass index is 26.17 kg/m. Physical Exam  Constitutional:  Well-developed and well-nourished.  HENT:  Head: Normocephalic.  Mouth/Throat: Oropharynx is clear and moist.  Eyes: Pupils are equal, round, and reactive to light.  Neck: Neck supple.  Cardiovascular: Normal rate and normal heart sounds.  No murmur heard. Pulmonary/Chest: Effort normal and breath sounds normal. No respiratory distress. No wheezes. She has no rales.  Abdominal: Soft. Bowel sounds are normal. No distension. There is no tenderness. There is no rebound.  Musculoskeletal: No edema.  Lymphadenopathy: none Neurological: Has unstable gait Skin: Skin is warm and dry.  Psychiatric: Normal mood and affect. Behavior is normal. Thought content normal.    Labs reviewed: Basic Metabolic Panel: Recent Labs    09/13/20 0000  NA 143  K 3.8  CL 108  CO2 29*  BUN 21  CREATININE 1.2  CALCIUM 9.4  TSH 1.46   Liver Function Tests: Recent Labs    09/13/20 0000  AST 24  ALT 41*  ALKPHOS 65  ALBUMIN 3.9   No results for input(s): LIPASE, AMYLASE in the last 8760 hours. No results for input(s): AMMONIA in the last  8760 hours. CBC: Recent Labs    09/13/20 0000  WBC 6.5  NEUTROABS 4,264.00  HGB 15.5  HCT 46  PLT 246   Lipid Panel: Recent Labs    09/13/20 0000  CHOL 177  HDL 45  LDLCALC 109   No results found for: HGBA1C  Procedures since last visit: No results found.  Assessment/Plan 1. 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 Depression, recurrent ([place]) Continue on Cymbalta And consider adding in a medication if continues to have symptoms of depression  3. Essential hypertension Blood pressure is stable on  Hyzaar  4. Chronic bilateral low back pain without sciatica Continue on Butrans and Lyrica 5. Hyperlipidemia, unspecified hyperlipidemia type On Pravachol  6. Neuropathy Continue on  Lyrica 7 BPH Symptoms controlled on Flomax ACP Is DNR and Limited Intervention per Wife  Palliative care  Labs/tests ordered:  * No order type specified * Next appt:  Visit date not found  Total time spent in this patient care encounter was  45_  minutes; greater than 50% of the visit spent counseling family and staff, reviewing records , Labs and coordinating care for problems addressed at this encounter.

## 2020-10-08 ENCOUNTER — Encounter: Payer: Self-pay | Admitting: Nurse Practitioner

## 2020-10-08 ENCOUNTER — Non-Acute Institutional Stay (SKILLED_NURSING_FACILITY): Payer: Medicare Other | Admitting: Nurse Practitioner

## 2020-10-08 DIAGNOSIS — G300 Alzheimer's disease with early onset: Secondary | ICD-10-CM

## 2020-10-08 DIAGNOSIS — G8929 Other chronic pain: Secondary | ICD-10-CM

## 2020-10-08 DIAGNOSIS — G609 Hereditary and idiopathic neuropathy, unspecified: Secondary | ICD-10-CM

## 2020-10-08 DIAGNOSIS — G629 Polyneuropathy, unspecified: Secondary | ICD-10-CM | POA: Insufficient documentation

## 2020-10-08 DIAGNOSIS — F0281 Dementia in other diseases classified elsewhere with behavioral disturbance: Secondary | ICD-10-CM

## 2020-10-08 DIAGNOSIS — I10 Essential (primary) hypertension: Secondary | ICD-10-CM

## 2020-10-08 DIAGNOSIS — N400 Enlarged prostate without lower urinary tract symptoms: Secondary | ICD-10-CM | POA: Diagnosis not present

## 2020-10-08 DIAGNOSIS — E785 Hyperlipidemia, unspecified: Secondary | ICD-10-CM | POA: Diagnosis not present

## 2020-10-08 DIAGNOSIS — F339 Major depressive disorder, recurrent, unspecified: Secondary | ICD-10-CM

## 2020-10-08 DIAGNOSIS — M545 Low back pain, unspecified: Secondary | ICD-10-CM

## 2020-10-08 NOTE — Assessment & Plan Note (Signed)
takes Cymbalta, Lorazepam, TSH 1.46 09/13/20

## 2020-10-08 NOTE — Assessment & Plan Note (Signed)
No urinary retention,  takes Tamsulosin 

## 2020-10-08 NOTE — Progress Notes (Signed)
Location:   SNF [place] Room Number: 8/A Place of Service:  SNF (31) Provider: [facility] Zarya Lasseigne NP  Stephens Shire, MD  Patient Care Team: Stephens Shire, MD as PCP - General [facility] Medicine)  Extended Emergency Contact Information Primary Emergency Contact: Bensinger,Donna Address: [address]          [place], [place] [address] Johnnette Litter of [place] Phone: [phone] Mobile Phone: [phone] Relation: Spouse  Code Status:  DNR Goals of care: Advanced Directive information Advanced Directives 10/08/2020  Does Patient Have a Medical Advance Directive? Yes  Type of Paramedic of [place];Living will;Out of facility DNR (pink MOST or yellow form)  Does patient want to make changes to medical advance directive? No - Patient declined  Copy of [place] in Chart? Yes - validated most recent copy scanned in chart (See row information)  Would patient like information on creating a medical advance directive? -  Pre-existing out of facility DNR order (yellow form or pink MOST form) Yellow form placed in chart (order not valid for inpatient use);Pink MOST form placed in chart (order not valid for inpatient use)     Chief Complaint  Patient presents with  . Medical Management of Chronic Issues    Routine Visit of Medical Management     HPI:  Pt is a 65 y.o. male seen today for medical management of chronic diseases.      HTN, takes Losartan/HCTZ, Bun/creat 21/1.2 09/13/20  Chronic lower back pain without sciatica, takes Pregabalin, Meloxica, Buprenorphine, Tylenol.   Peripheral neuropathy, takes Pregabalin  Hyperlipidemia, takes Pravastatin, LDL 109 09/13/20  Depression, takes Cymbalta, Lorazepam, TSH 1.46 09/13/20  BPH takes Tamsulosin  Alzheimer's dementia, takes Memantine, Donepezil, sundowning behavioral issues, agitation, started Zyperxa 5mg  qhs 10/07/20, prn Lorazepam.    Past Medical History:  Diagnosis Date  .  Hyperlipidemia   . Hypertension   . Seasonal allergies    Past Surgical History:  Procedure Laterality Date  . HAMMERTOE RECONSTRUCTION WITH WEIL OSTEOTOMY  08/01/2012   Procedure: HAMMERTOE RECONSTRUCTION WITH WEIL OSTEOTOMY;  Surgeon: Wylene Simmer, MD;  Location: [place];  Service: Orthopedics;  Laterality: Right;  RIGHT SCARF OSTEOTOMY, MODIFIED MCBRIDE BUNIONECTOMY, , RIGHT 2ND - 4TH WEIL, RIGHT 2ND - 5TH DORSAL CAPSULOTOMIES, RIGHT 2ND - 4TH PIP ARTHRODESIS  . LIPOMA EXCISION  08/01/2012   Procedure: EXCISION LIPOMA;  Surgeon: Rolm Bookbinder, MD;  Location: [place];  Service: General;  Laterality: N/A;  EXCISION LIPOMAS X 6 (LEFT TRICEP, RIGHT TRICEP, DELTOID, BIL ABDOMINAL WALL AND LEFT THIGH)  . NASAL SINUS SURGERY    . SHOULDER SURGERY     right    No Known Allergies  Allergies as of 10/08/2020   No Known Allergies     Medication List       Accurate as of October 08, 2020 11:59 PM. If you have any questions, ask your nurse or doctor.        acetaminophen 500 MG tablet Commonly known as: TYLENOL Take 1,000 mg by mouth 3 (three) times daily.   buprenorphine 10 MCG/HR Ptwk Commonly known as: BUTRANS Place 1 patch onto the skin once a week.   cetirizine 10 MG tablet Commonly known as: ZYRTEC Take 10 mg by mouth daily.   donepezil 10 MG tablet Commonly known as: ARICEPT Take 20 mg by mouth every morning.   DULoxetine 60 MG capsule Commonly known as: CYMBALTA Take 60 mg by mouth 2 (two)  times daily.   LORazepam 0.5 MG tablet Commonly known as: ATIVAN Take 0.5 mg by mouth 2 (two) times daily as needed for anxiety.   losartan-hydrochlorothiazide 100-12.5 MG tablet Commonly known as: HYZAAR Take 1 tablet by mouth daily.   meloxicam 7.5 MG tablet Commonly known as: MOBIC Take 7.5 mg by mouth daily.   memantine 10 MG tablet Commonly known as: NAMENDA Take 10 mg by mouth 2 (two) times daily.   OLANZapine 5 MG tablet Commonly  known as: ZYPREXA Take 5 mg by mouth daily.   pravastatin 40 MG tablet Commonly known as: PRAVACHOL Take 40 mg by mouth daily.   pregabalin 100 MG capsule Commonly known as: LYRICA Take 100 mg by mouth 2 (two) times daily.   tamsulosin 0.4 MG Caps capsule Commonly known as: FLOMAX Take 0.4 mg by mouth daily.   zinc oxide 20 % ointment Apply 1 application topically as needed for irritation.       Review of Systems  Constitutional: Negative for activity change, appetite change and unexpected weight change.  HENT: Negative for congestion, hearing loss, trouble swallowing and voice change.   Eyes: Negative for visual disturbance.  Respiratory: Negative for cough and shortness of breath.   Cardiovascular: Negative for chest pain, palpitations and leg swelling.  Gastrointestinal: Negative for abdominal pain, constipation, nausea and vomiting.  Genitourinary: Negative for dysuria and urgency.  Musculoskeletal: Positive for arthralgias, back pain and gait problem.  Skin: Negative for color change.  Neurological: Negative for tremors, weakness and headaches.       Dementia, ROS was provided with assistance of staff.   Psychiatric/Behavioral: Positive for agitation and behavioral problems. Negative for hallucinations and sleep disturbance. The patient is not nervous/anxious.     Immunization History  Administered Date(s) Administered  . Influenza Split 06/09/2015, 04/04/2016, 02/27/2017  . Influenza,inj,Quad PF,6-35 Mos 04/03/2019, 04/06/2020  . PFIZER(Purple Top)SARS-COV-2 Vaccination 08/25/2019, 09/16/2019, 05/03/2020  . Tdap 08/01/2010  . Zoster 08/24/2016   Pertinent  Health Maintenance Due  Topic Date Due  . COLONOSCOPY (Pts 45-37yrs Insurance coverage will need to be confirmed)  Never done  . PNA vac Low Risk Adult (1 of 2 - PCV13) Never done  . INFLUENZA VACCINE  02/07/2021   Fall Risk  03/12/2015 03/01/2015  Falls in the past year? No No  Risk for fall due to : Other  (Comment) -   Functional Status Survey:    Vitals:   10/08/20 1641  BP: 127/68  Pulse: 74  Resp: 18  Temp: (!) 97.1 F (36.2 C)  SpO2: 92%  Weight: 215 lb (97.5 kg)  Height: 6\' 4"  (1.93 m)   Body mass index is 26.17 kg/m. Physical Exam Vitals and nursing note reviewed.  Constitutional:      General: He is not in acute distress.    Appearance: He is not ill-appearing, toxic-appearing or diaphoretic.  HENT:     Head: Normocephalic and atraumatic.     Nose: Nose normal.     Mouth/Throat:     Mouth: Mucous membranes are moist.  Eyes:     Extraocular Movements: Extraocular movements intact.     Conjunctiva/sclera: Conjunctivae normal.     Pupils: Pupils are equal, round, and reactive to light.  Cardiovascular:     Rate and Rhythm: Normal rate and regular rhythm.     Heart sounds: No murmur heard.   Pulmonary:     Effort: Pulmonary effort is normal.     Breath sounds: No wheezing, rhonchi or rales.  Abdominal:     General: Bowel sounds are normal.     Palpations: Abdomen is soft.     Tenderness: There is no abdominal tenderness.  Musculoskeletal:        General: Normal range of motion.     Cervical back: Normal range of motion and neck supple.     Right lower leg: No edema.     Left lower leg: No edema.  Skin:    General: Skin is warm and dry.  Neurological:     General: No focal deficit present.     Mental Status: He is alert. Mental status is at baseline.     Motor: No weakness.     Coordination: Coordination normal.     Gait: Gait abnormal.     Comments: Oriented to person  Psychiatric:     Comments: Followed simple directions during my examination.      Labs reviewed: Recent Labs    09/13/20 0000  NA 143  K 3.8  CL 108  CO2 29*  BUN 21  CREATININE 1.2  CALCIUM 9.4   Recent Labs    09/13/20 0000  AST 24  ALT 41*  ALKPHOS 65  ALBUMIN 3.9   Recent Labs    09/13/20 0000  WBC 6.5  NEUTROABS 4,264.00  HGB 15.5  HCT 46  PLT 246   Lab  Results  Component Value Date   TSH 1.46 09/13/2020   No results found for: HGBA1C Lab Results  Component Value Date   CHOL 177 09/13/2020   HDL 45 09/13/2020   LDLCALC 109 09/13/2020    Significant Diagnostic Results in last 30 days:  No results found.  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    BPH (benign prostatic hyperplasia) No urinary retention, takes Tamsulosin.   Depression, recurrent ([place]) takes Cymbalta, Lorazepam, TSH 1.46 09/13/20   Hyperlipidemia takes Pravastatin, LDL 109 09/13/20   Peripheral neuropathy Stable, takes Pregabalin.   Chronic lower back pain takes Pregabalin, Meloxica, Buprenorphine, Tylenol.    HTN (hypertension) takes Losartan/HCTZ, Bun/creat 21/1.2 09/13/20    Family/ staff Communication: plan of care reviewed with the patient and charge nurse.   Labs/tests ordered: none  Time spend 35 minutes.

## 2020-10-08 NOTE — Assessment & Plan Note (Signed)
takes Pregabalin, Meloxica, Buprenorphine, Tylenol.

## 2020-10-08 NOTE — Assessment & Plan Note (Signed)
takes Losartan/HCTZ, Bun/creat 21/1.2 09/13/20

## 2020-10-08 NOTE — Assessment & Plan Note (Signed)
takes Memantine, Donepezil, sundowning behavioral issues, agitation, started Zyperxa 5mg  qhs 10/07/20, prn Lorazepam.

## 2020-10-08 NOTE — Assessment & Plan Note (Signed)
takes Pravastatin, LDL 109 09/13/20

## 2020-10-08 NOTE — Assessment & Plan Note (Signed)
Stable, takes Pregabalin.

## 2020-10-11 ENCOUNTER — Encounter: Payer: Self-pay | Admitting: Nurse Practitioner

## 2020-10-12 ENCOUNTER — Non-Acute Institutional Stay: Payer: Medicare Other | Admitting: Student

## 2020-10-12 ENCOUNTER — Other Ambulatory Visit: Payer: Self-pay

## 2020-10-12 DIAGNOSIS — G300 Alzheimer's disease with early onset: Secondary | ICD-10-CM

## 2020-10-12 DIAGNOSIS — G8929 Other chronic pain: Secondary | ICD-10-CM

## 2020-10-12 DIAGNOSIS — Z515 Encounter for palliative care: Secondary | ICD-10-CM

## 2020-10-12 DIAGNOSIS — M545 Low back pain, unspecified: Secondary | ICD-10-CM

## 2020-10-12 DIAGNOSIS — F0281 Dementia in other diseases classified elsewhere with behavioral disturbance: Secondary | ICD-10-CM

## 2020-10-12 NOTE — Progress Notes (Signed)
[facility] Palliative Care Consult Note Telephone: [phone]  Fax: [phone]  PATIENT NAME: Juan Arroyo [address] [place] [address] [phone] (home) [phone] (work) DOB: 06/26/1956 MRN: [id number]  PRIMARY CARE PROVIDER:    Stephens Shire, MD,  [address] [place] [address] [phone]  REFERRING PROVIDER:   Stephens Shire, MD [address] [place],  [place] [address] [phone]  RESPONSIBLE PARTY:   Extended Emergency Contact Information Primary Emergency Contact: Hewins,Donna Address: [address]          [place],  [address] Johnnette Litter of [place] Phone: [phone] Mobile Phone: [phone] Relation: Spouse  I met face to face with patient in facility.   ASSESSMENT AND RECOMMENDATIONS:   Advance Care Planning: Visit at the request of Dr. Lyndel Safe for palliative consult. Visit consisted of building trust and discussions on Palliative care medicine as specialized medical care for people living with serious illness, aimed at facilitating improved quality of life through symptoms relief, assisting with advance care planning and establishing goals of care. Education provided on Palliative Medicine vs. Hospice services. Palliative care will continue to provide support to patient, family and the medical team.  Goal of care: For patient to be comfortable, symptoms managed.  Directives: MOST, DNR.  Symptom Management:   Pain-patient with chronic low back pain. Butrans patch recently increased to 51mg/hr. Recommend: lidocaine 4% patch apply to affected area (lower back) daily PRN, on 12 hours/off 12 hours. Continue buprenophrine patch14m/hr, change weekly on Fridays, duloxetine 6058maily, meloxicam 7.5mg79mily, Lyrica 100mg81m, acetaminophen 1000mg 71m Continue PT as able to tolerate. Staff to assist with adl's.  Alzheimer's dementia-FAST score 6D. Patient  requires assistance with adl's. Recommend: reorient/redirect as needed. Monitor for safety and falls, wander guard in place. PT/OT/ST as directed. Continue memantine 10mg d45m, donepezil 20mg da20m   Follow up Palliative Care Visit: Palliative care will continue to follow for complex decision making and symptom management. Return in 4 weeks or prn.  Family /Caregiver/Community Supports: Palliative Medicine will continue to provide support.    I spent 35 minutes providing this consultation, time includes time spent with patient/family, chart review, provider coordination, and documentation. More than 50% of the time in this consultation was spent counseling and coordinating communication.   CHIEF COMPLAINT: Palliative Medicine follow up visit.   History obtained from review of EMR, discussion with primary team, and  interview with family. Records reviewed and summarized below.  HISTORY OF PRESENT ILLNESS:  Juan Arroyo y.o. 75ar old male with multiple medical problems including Alzheimer's Dementia, chronic low back pain, hypertension, hyperlipidemia. Palliative Care was asked to follow this patient by consultation request of Dr. Gupta toLyndel Safe address advance care planning and goals of care. This is a follow up visit.  Patient denies pain at rest; does occasionally report pain with movement, ambulation. Increased agitation, packing up items in room, pacing mostly observed by staff in the evening. Good appetite endorsed. Sleeping well. Monitored for falls/safety.   CODE STATUS: DNR  PPS: 50%  HOSPICE ELIGIBILITY/DIAGNOSIS: TBD  ROS   General: NAD EYES: denies vision changes ENMT: denies dysphagia Cardiovascular: denies chest pain Pulmonary: denies cough, denies increased SOB Abdomen: endorses good appetite GU: denies dysuria MSK: no falls reported Skin: denies rashes or wounds Neurological: endorses weakness, denies insomnia Psych: Endorses positive  mood Heme/lymph/immuno: denies bruises, abnormal bleeding   Physical Exam: Weight: 215 pounds Constitutional: NAD  General: Alert & oriented to person, well groomed EYES: anicteric sclera, lids intact, no discharge  ENMT: intact hearing,oral mucous membranes moist CV: RRR,  no LE edema Pulmonary: LCTA, no increased work of breathing, no cough Abdomen: BS normoactive x 4 GU: deferred MSK: ambulatory Skin: warm and dry, no rashes or wounds on visible skin Neuro: Generalized weakness Psych: non-anxious affect today Hem/lymph/immuno: no widespread bruising   PAST MEDICAL HISTORY:  Past Medical History:  Diagnosis Date  . Hyperlipidemia   . Hypertension   . Seasonal allergies     SOCIAL HX:  Social History   Tobacco Use  . Smoking status: Never Smoker  . Smokeless tobacco: Never Used  Substance Use Topics  . Alcohol use: Yes    Comment: occ   FAMILY HX: No family history on file.  ALLERGIES: No Known Allergies   PERTINENT MEDICATIONS:  Outpatient Encounter Medications as of 10/12/2020  Medication Sig  . acetaminophen (TYLENOL) 500 MG tablet Take 1,000 mg by mouth 3 (three) times daily.  . buprenorphine (BUTRANS) 10 MCG/HR PTWK Place 1 patch onto the skin once a week.  . cetirizine (ZYRTEC) 10 MG tablet Take 10 mg by mouth daily.  Marland Kitchen donepezil (ARICEPT) 10 MG tablet Take 20 mg by mouth every morning.  . DULoxetine (CYMBALTA) 60 MG capsule Take 60 mg by mouth 2 (two) times daily.  Marland Kitchen LORazepam (ATIVAN) 0.5 MG tablet Take 0.5 mg by mouth 2 (two) times daily as needed for anxiety.  Marland Kitchen losartan-hydrochlorothiazide (HYZAAR) 100-12.5 MG per tablet Take 1 tablet by mouth daily.  . meloxicam (MOBIC) 7.5 MG tablet Take 7.5 mg by mouth daily.  . memantine (NAMENDA) 10 MG tablet Take 10 mg by mouth 2 (two) times daily.  Marland Kitchen OLANZapine (ZYPREXA) 5 MG tablet Take 5 mg by mouth daily.  . pravastatin (PRAVACHOL) 40 MG tablet Take 40 mg by mouth daily.  . pregabalin (LYRICA) 100 MG capsule  Take 100 mg by mouth 2 (two) times daily.  . tamsulosin (FLOMAX) 0.4 MG CAPS capsule Take 0.4 mg by mouth daily.  Marland Kitchen zinc oxide 20 % ointment Apply 1 application topically as needed for irritation.   No facility-administered encounter medications on file as of 10/12/2020.     Thank you for the opportunity to participate in the care of Mr. Tucholski. The palliative care team will continue to follow. Please call our office at [phone] if we can be of additional assistance.  Ezekiel Slocumb, NP

## 2020-10-14 ENCOUNTER — Non-Acute Institutional Stay (SKILLED_NURSING_FACILITY): Payer: Medicare Other | Admitting: Internal Medicine

## 2020-10-14 ENCOUNTER — Encounter: Payer: Self-pay | Admitting: Internal Medicine

## 2020-10-14 DIAGNOSIS — F0281 Dementia in other diseases classified elsewhere with behavioral disturbance: Secondary | ICD-10-CM

## 2020-10-14 DIAGNOSIS — F339 Major depressive disorder, recurrent, unspecified: Secondary | ICD-10-CM | POA: Diagnosis not present

## 2020-10-14 DIAGNOSIS — G8929 Other chronic pain: Secondary | ICD-10-CM

## 2020-10-14 DIAGNOSIS — E785 Hyperlipidemia, unspecified: Secondary | ICD-10-CM | POA: Diagnosis not present

## 2020-10-14 DIAGNOSIS — N400 Enlarged prostate without lower urinary tract symptoms: Secondary | ICD-10-CM

## 2020-10-14 DIAGNOSIS — G300 Alzheimer's disease with early onset: Secondary | ICD-10-CM | POA: Diagnosis not present

## 2020-10-14 DIAGNOSIS — G609 Hereditary and idiopathic neuropathy, unspecified: Secondary | ICD-10-CM

## 2020-10-14 DIAGNOSIS — M545 Low back pain, unspecified: Secondary | ICD-10-CM

## 2020-10-14 NOTE — Progress Notes (Signed)
Location:    [facility] Room Number: 8 Place of Service:  SNF [phone]) Provider:  Veleta Miners MD  Stephens Shire, MD  Patient Care Team: Stephens Shire, MD as PCP - General [facility])  Extended Emergency Contact Information Primary Emergency Contact: Lady,Donna Address: [address]          [place], [place] [address] Johnnette Litter of [place] Phone: [phone] Mobile Phone: [phone] Relation: Spouse  Code Status:  DNR Palliative Care Goals of care: Advanced Directive information Advanced Directives 10/08/2020  Does Patient Have a Medical Advance Directive? Yes  Type of Paramedic of [place];Living will;Out of facility DNR (pink MOST or yellow form)  Does patient want to make changes to medical advance directive? No - Patient declined  Copy of [place] in Chart? Yes - validated most recent copy scanned in chart (See row information)  Would patient like information on creating a medical advance directive? -  Pre-existing out of facility DNR order (yellow form or pink MOST form) Yellow form placed in chart (order not valid for inpatient use);Pink MOST form placed in chart (order not valid for inpatient use)     Chief Complaint  Patient presents with  . Acute Visit    HPI:  Pt is a 65 y.o. male seen today for an acute visit for Behavior issues  Patient has a history of Alzheimer's dementia with behavioral disturbance. Has had extensive work-up in [facility]. Was diagnosed at age of 61. Had MRI done which showed severe atrophy and small vessel ischemia. Has been on Aricept and Namenda. His wife who is the main caregiver has moved him here due to his underlying physical decline and cognitive decline Has history of chronic back pain MRI has shown lumbar stenosislat L3-4, L4-5. Prior L2 compression fracture On high doses of Lyrica and started on Butransper Neurology  Started on Zyprexa  and PRN ativan few weeks ago But yesterday per nurses got very agitated especially in the evening Would not let them direct to his room. Would go in other peoples room This morning he is fine again. Pleasant sitting in his room No anxiety noticed No Fever or any other distress Past Medical History:  Diagnosis Date  . Hyperlipidemia   . Hypertension   . Seasonal allergies    Past Surgical History:  Procedure Laterality Date  . HAMMERTOE RECONSTRUCTION WITH WEIL OSTEOTOMY  08/01/2012   Procedure: HAMMERTOE RECONSTRUCTION WITH WEIL OSTEOTOMY;  Surgeon: Wylene Simmer, MD;  Location: [facility];  Service: Orthopedics;  Laterality: Right;  RIGHT SCARF OSTEOTOMY, MODIFIED MCBRIDE BUNIONECTOMY, , RIGHT 2ND - 4TH WEIL, RIGHT 2ND - 5TH DORSAL CAPSULOTOMIES, RIGHT 2ND - 4TH PIP ARTHRODESIS  . LIPOMA EXCISION  08/01/2012   Procedure: EXCISION LIPOMA;  Surgeon: Rolm Bookbinder, MD;  Location: [facility];  Service: General;  Laterality: N/A;  EXCISION LIPOMAS X 6 (LEFT TRICEP, RIGHT TRICEP, DELTOID, BIL ABDOMINAL WALL AND LEFT THIGH)  . NASAL SINUS SURGERY    . SHOULDER SURGERY     right    No Known Allergies  Allergies as of 10/14/2020   No Known Allergies     Medication List       Accurate as of October 14, 2020 11:56 AM. If you have any questions, ask your nurse or doctor.        acetaminophen 500 MG tablet Commonly known as: TYLENOL Take 1,000 mg by mouth 3 (three) times daily.  buprenorphine 10 MCG/HR Ptwk Commonly known as: BUTRANS Place 1 patch onto the skin once a week.   cetirizine 10 MG tablet Commonly known as: ZYRTEC Take 10 mg by mouth daily.   donepezil 10 MG tablet Commonly known as: ARICEPT Take 20 mg by mouth every morning.   DULoxetine 60 MG capsule Commonly known as: CYMBALTA Take 60 mg by mouth 2 (two) times daily.   HM Lidocaine Patch 4 % Ptch Generic drug: Lidocaine Apply 1 patch topically daily as needed.   LORazepam 0.5 MG  tablet Commonly known as: ATIVAN Take 0.5 mg by mouth 2 (two) times daily as needed for anxiety.   losartan-hydrochlorothiazide 100-12.5 MG tablet Commonly known as: HYZAAR Take 1 tablet by mouth daily.   meloxicam 7.5 MG tablet Commonly known as: MOBIC Take 7.5 mg by mouth daily.   memantine 10 MG tablet Commonly known as: NAMENDA Take 10 mg by mouth 2 (two) times daily.   OLANZapine 5 MG tablet Commonly known as: ZYPREXA Take 5 mg by mouth daily.   pravastatin 40 MG tablet Commonly known as: PRAVACHOL Take 40 mg by mouth daily.   pregabalin 100 MG capsule Commonly known as: LYRICA Take 100 mg by mouth 2 (two) times daily.   tamsulosin 0.4 MG Caps capsule Commonly known as: FLOMAX Take 0.4 mg by mouth daily.   zinc oxide 20 % ointment Apply 1 application topically as needed for irritation.       Review of Systems  Unable to perform ROS: Dementia  Per Nurses no other acute issues except agitation  Immunization History  Administered Date(s) Administered  . Influenza Split 06/09/2015, 04/04/2016, 02/27/2017  . Influenza,inj,Quad PF,6-35 Mos 04/03/2019, 04/06/2020  . PFIZER(Purple Top)SARS-COV-2 Vaccination 08/25/2019, 09/16/2019, 05/03/2020  . Tdap 08/01/2010  . Zoster 08/24/2016   Pertinent  Health Maintenance Due  Topic Date Due  . COLONOSCOPY (Pts 45-33yrs Insurance coverage will need to be confirmed)  Never done  . PNA vac Low Risk Adult (1 of 2 - PCV13) Never done  . INFLUENZA VACCINE  02/07/2021   Fall Risk  03/12/2015 03/01/2015  Falls in the past year? No No  Risk for fall due to : Other (Comment) -   Functional Status Survey:    Vitals:   10/14/20 1151  BP: (!) 144/92  Pulse: 68  Resp: 20  Temp: (!) 97.1 F (36.2 C)  SpO2: 94%  Weight: 215 lb (97.5 kg)  Height: 6\' 4"  (1.93 m)   Body mass index is 26.17 kg/m. Physical Exam Constitutional: . Well-developed and well-nourished.  HENT:  Head: Normocephalic.  Mouth/Throat: Oropharynx is  clear and moist.  Eyes: Pupils are equal, round, and reactive to light.  Neck: Neck supple.  Cardiovascular: Normal rate and normal heart sounds.  No murmur heard. Pulmonary/Chest: Effort normal and breath sounds normal. No respiratory distress. No wheezes. She has no rales.  Abdominal: Soft. Bowel sounds are normal. No distension. There is no tenderness. There is no rebound.  Musculoskeletal: No edema.  Lymphadenopathy: none Neurological:No Focal deficits Walks with his walker But has Unstable gait stays risk for falls.  Skin: Skin is warm and dry.  Psychiatric: Normal mood and affect. Behavior is normal. Thought content normal.   Labs reviewed: Recent Labs    09/13/20 0000  NA 143  K 3.8  CL 108  CO2 29*  BUN 21  CREATININE 1.2  CALCIUM 9.4   Recent Labs    09/13/20 0000  AST 24  ALT 41*  ALKPHOS 65  ALBUMIN 3.9   Recent Labs    09/13/20 0000  WBC 6.5  NEUTROABS 4,264.00  HGB 15.5  HCT 46  PLT 246   Lab Results  Component Value Date   TSH 1.46 09/13/2020   No results found for: HGBA1C Lab Results  Component Value Date   CHOL 177 09/13/2020   HDL 45 09/13/2020   LDLCALC 109 09/13/2020    Significant Diagnostic Results in last 30 days:  No results found.  Assessment/Plan  Early onset Alzheimer's dementia with behavioral disturbance ([place]) Start on Depakote 125mg  BID Repeat hepatic panel and CBC in 8 weeks Continue Zyprexa and Ativan PRN Also on high doses of Aricept and Namenda  Other Issues  Depression, recurrent (HCC) Continue on Cymbalta And consider adding in a medication if continues to have symptoms of depression   Essential hypertension Blood pressure is stable on Hyzaar   Chronic bilateral low back pain without sciatica Continue on Butrans and Lyrica  Hyperlipidemia, unspecified hyperlipidemia type On Pravachol   Neuropathy Continue on  Lyrica  BPH Symptoms controlled on Flomax ACP Is DNR and Limited Intervention per  Wife  Palliative care Family/ staff Communication:   Labs/tests ordered:

## 2020-10-20 ENCOUNTER — Other Ambulatory Visit: Payer: Self-pay

## 2020-10-21 MED ORDER — LORAZEPAM 0.5 MG PO TABS: 0.5000 mg | ORAL_TABLET | Freq: Two times a day (BID) | ORAL | 0 refills | Status: AC

## 2020-10-21 NOTE — Telephone Encounter (Signed)
Awaiting reply form Virgie Dad, MD

## 2020-10-28 LAB — CBC AND DIFFERENTIAL
HCT: 48 (ref 41–53)
Hemoglobin: 15.7 (ref 13.5–17.5)
Platelets: 316 (ref 150–399)
WBC: 5.6

## 2020-10-28 LAB — CBC: RBC: 5.3 — AB (ref 3.87–5.11)

## 2020-11-01 ENCOUNTER — Encounter: Payer: Self-pay | Admitting: Orthopedic Surgery

## 2020-11-01 ENCOUNTER — Non-Acute Institutional Stay (SKILLED_NURSING_FACILITY): Payer: Medicare Other | Admitting: Orthopedic Surgery

## 2020-11-01 DIAGNOSIS — J302 Other seasonal allergic rhinitis: Secondary | ICD-10-CM

## 2020-11-01 DIAGNOSIS — F339 Major depressive disorder, recurrent, unspecified: Secondary | ICD-10-CM | POA: Diagnosis not present

## 2020-11-01 DIAGNOSIS — G8929 Other chronic pain: Secondary | ICD-10-CM

## 2020-11-01 DIAGNOSIS — N400 Enlarged prostate without lower urinary tract symptoms: Secondary | ICD-10-CM

## 2020-11-01 DIAGNOSIS — F0281 Dementia in other diseases classified elsewhere with behavioral disturbance: Secondary | ICD-10-CM

## 2020-11-01 DIAGNOSIS — I10 Essential (primary) hypertension: Secondary | ICD-10-CM

## 2020-11-01 DIAGNOSIS — G300 Alzheimer's disease with early onset: Secondary | ICD-10-CM

## 2020-11-01 DIAGNOSIS — F02818 Dementia in other diseases classified elsewhere, unspecified severity, with other behavioral disturbance: Secondary | ICD-10-CM

## 2020-11-01 DIAGNOSIS — E785 Hyperlipidemia, unspecified: Secondary | ICD-10-CM

## 2020-11-01 DIAGNOSIS — G609 Hereditary and idiopathic neuropathy, unspecified: Secondary | ICD-10-CM

## 2020-11-01 DIAGNOSIS — M545 Low back pain, unspecified: Secondary | ICD-10-CM

## 2020-11-01 NOTE — Progress Notes (Signed)
Location:   [facility] Room Number: 8 Place of Service:  SNF [phone]) Provider: Windell Moulding, NP  Virgie Dad, MD  Patient Care Team: Virgie Dad, MD as PCP - General (Internal Medicine)  Extended Emergency Contact Information Primary Emergency Contact: Inlow,Donna Address: [address]          [place], [place] [address] Johnnette Litter of [place] Phone: [phone] Mobile Phone: [phone] Relation: Spouse  Code Status: DNR  Goals of care: Advanced Directive information Advanced Directives 11/01/2020  Does Patient Have a Medical Advance Directive? Yes  Type of Paramedic of [place];Living will;Out of facility DNR (pink MOST or yellow form)  Does patient want to make changes to medical advance directive? No - Patient declined  Copy of [place] in Chart? No - copy requested  Would patient like information on creating a medical advance directive? -  Pre-existing out of facility DNR order (yellow form or pink MOST form) Yellow form placed in chart (order not valid for inpatient use);Pink MOST form placed in chart (order not valid for inpatient use)     No chief complaint on file.   HPI:  Pt is a 65 y.o. male seen today for medical management of chronic diseases.    Wife present during encounter.   Today, he is alert to self and familiar faces. He cannot state year or name president. Follows commands and can express needs. Lower back pain continues to bother him. He has been on combination of lyrica, scheduled tylenol and butrans patch. Pain increased by certain activities like bending and twisting. Denies any numbness or tingling down legs. Wife states surgery is not an option due to advanced dementia. Concerned he will forget back precautions and injure back more. He had a few episodes in early April of restlessness and exit seeking behaviors in the evening. Ativan ordered for nighttime and behaviors  subsided. He was also started on zyprexa.   He complains of runny nose and itchy eyes. Wife states he gets increased seasonal allergies this time of year. Takes zyrtec daily, but does not seem to be helping. Also using eye drops for itching. Requesting new allergy medication.   No recent falls or injuries.   Recent blood pressures are as follows:  04/19- 126/70  04/12- 127/82  04/05- 144/92  03/29- 127/82  Recent weight trends:  03/30- 215 lbs  03/23- 214 lbs  03/02- 210 lbs      Past Medical History:  Diagnosis Date  . Hyperlipidemia   . Hypertension   . Seasonal allergies    Past Surgical History:  Procedure Laterality Date  . HAMMERTOE RECONSTRUCTION WITH WEIL OSTEOTOMY  08/01/2012   Procedure: HAMMERTOE RECONSTRUCTION WITH WEIL OSTEOTOMY;  Surgeon: Wylene Simmer, MD;  Location: [facility];  Service: Orthopedics;  Laterality: Right;  RIGHT SCARF OSTEOTOMY, MODIFIED MCBRIDE BUNIONECTOMY, , RIGHT 2ND - 4TH WEIL, RIGHT 2ND - 5TH DORSAL CAPSULOTOMIES, RIGHT 2ND - 4TH PIP ARTHRODESIS  . LIPOMA EXCISION  08/01/2012   Procedure: EXCISION LIPOMA;  Surgeon: Rolm Bookbinder, MD;  Location: [facility];  Service: General;  Laterality: N/A;  EXCISION LIPOMAS X 6 (LEFT TRICEP, RIGHT TRICEP, DELTOID, BIL ABDOMINAL WALL AND LEFT THIGH)  . NASAL SINUS SURGERY    . SHOULDER SURGERY     right    No Known Allergies  Allergies as of 11/01/2020   No Known Allergies     Medication List  Accurate as of November 01, 2020 11:36 AM. If you have any questions, ask your nurse or doctor.        acetaminophen 500 MG tablet Commonly known as: TYLENOL Take 1,000 mg by mouth 3 (three) times daily.   buprenorphine 10 MCG/HR Ptwk Commonly known as: BUTRANS Place 1 patch onto the skin once a week.   cetirizine 10 MG tablet Commonly known as: ZYRTEC Take 10 mg by mouth daily.   divalproex 125 MG DR tablet Commonly known as: DEPAKOTE Take 125 mg by mouth 2  (two) times daily.   donepezil 10 MG tablet Commonly known as: ARICEPT Take 20 mg by mouth every morning.   DULoxetine 60 MG capsule Commonly known as: CYMBALTA Take 60 mg by mouth 2 (two) times daily.   Lidocaine 4 % Ptch Apply 1 patch topically daily as needed.   LORazepam 0.5 MG tablet Commonly known as: ATIVAN Take 0.5 mg by mouth daily.   LORazepam 0.5 MG tablet Commonly known as: ATIVAN Take 1 tablet (0.5 mg total) by mouth in the morning and at bedtime for 14 days.   losartan-hydrochlorothiazide 100-12.5 MG tablet Commonly known as: HYZAAR Take 1 tablet by mouth daily.   meloxicam 7.5 MG tablet Commonly known as: MOBIC Take 7.5 mg by mouth daily.   memantine 10 MG tablet Commonly known as: NAMENDA Take 10 mg by mouth 2 (two) times daily.   OLANZapine 5 MG tablet Commonly known as: ZYPREXA Take 5 mg by mouth daily.   pravastatin 40 MG tablet Commonly known as: PRAVACHOL Take 40 mg by mouth daily.   pregabalin 100 MG capsule Commonly known as: LYRICA Take 100 mg by mouth 2 (two) times daily.   tamsulosin 0.4 MG Caps capsule Commonly known as: FLOMAX Take 0.4 mg by mouth daily.   zinc oxide 20 % ointment Apply 1 application topically as needed for irritation.       Review of Systems  Constitutional: Negative for activity change, appetite change, fatigue and fever.  HENT: Negative for dental problem, hearing loss and trouble swallowing.   Eyes: Negative for visual disturbance.       Glasses  Respiratory: Negative for cough, shortness of breath and wheezing.   Cardiovascular: Negative for chest pain and leg swelling.  Gastrointestinal: Negative for abdominal distention, abdominal pain, constipation, diarrhea and nausea.  Genitourinary: Positive for frequency. Negative for dysuria and hematuria.  Musculoskeletal: Positive for arthralgias, back pain and myalgias.  Skin: Negative.   Neurological: Negative for dizziness, weakness and headaches.   Psychiatric/Behavioral: Positive for behavioral problems and confusion. Negative for dysphoric mood. The patient is not nervous/anxious.     Immunization History  Administered Date(s) Administered  . Influenza Split 06/09/2015, 04/04/2016, 02/27/2017  . Influenza,inj,Quad PF,6-35 Mos 04/03/2019, 04/06/2020  . PFIZER(Purple Top)SARS-COV-2 Vaccination 08/25/2019, 09/16/2019, 05/03/2020  . Tdap 08/01/2010  . Zoster 08/24/2016   Pertinent  Health Maintenance Due  Topic Date Due  . COLONOSCOPY (Pts 45-83yrs Insurance coverage will need to be confirmed)  Never done  . PNA vac Low Risk Adult (1 of 2 - PCV13) Never done  . INFLUENZA VACCINE  02/07/2021   Fall Risk  03/12/2015 03/01/2015  Falls in the past year? No No  Risk for fall due to : Other (Comment) -   Functional Status Survey:    Vitals:   11/01/20 1115  BP: 126/70  Pulse: 78  Resp: 20  Temp: 98.4 F (36.9 C)  SpO2: 97%  Weight: 215 lb (97.5 kg)  Height:  6\' 4"  (1.93 m)   Body mass index is 26.17 kg/m. Physical Exam Vitals reviewed.  Constitutional:      General: He is not in acute distress. HENT:     Head: Normocephalic.     Right Ear: There is no impacted cerumen.     Left Ear: There is no impacted cerumen.     Nose: Congestion present.     Comments: clear    Mouth/Throat:     Mouth: Mucous membranes are moist.  Eyes:     General:        Right eye: No discharge.        Left eye: No discharge.     Comments: watery  Cardiovascular:     Rate and Rhythm: Normal rate and regular rhythm.     Pulses: Normal pulses.     Heart sounds: Normal heart sounds. No murmur heard.   Pulmonary:     Effort: Pulmonary effort is normal. No respiratory distress.     Breath sounds: Normal breath sounds.  Abdominal:     General: Bowel sounds are normal. There is no distension.     Palpations: Abdomen is soft.     Tenderness: There is no abdominal tenderness.  Musculoskeletal:     Cervical back: Normal range of motion.      Right lower leg: No edema.     Left lower leg: No edema.     Comments: Wander guard to left ankle  Lymphadenopathy:     Cervical: No cervical adenopathy.  Skin:    General: Skin is warm and dry.     Capillary Refill: Capillary refill takes less than 2 seconds.  Neurological:     General: No focal deficit present.     Mental Status: He is alert. Mental status is at baseline.     Motor: Weakness present.     Gait: Gait abnormal.     Comments: Wheelchair/walker  Psychiatric:        Mood and Affect: Mood normal.        Behavior: Behavior normal.        Cognition and Memory: Memory is impaired.     Labs reviewed: Recent Labs    09/13/20 0000  NA 143  K 3.8  CL 108  CO2 29*  BUN 21  CREATININE 1.2  CALCIUM 9.4   Recent Labs    09/13/20 0000  AST 24  ALT 41*  ALKPHOS 65  ALBUMIN 3.9   Recent Labs    09/13/20 0000  WBC 6.5  NEUTROABS 4,264.00  HGB 15.5  HCT 46  PLT 246   Lab Results  Component Value Date   TSH 1.46 09/13/2020   No results found for: HGBA1C Lab Results  Component Value Date   CHOL 177 09/13/2020   HDL 45 09/13/2020   LDLCALC 109 09/13/2020    Significant Diagnostic Results in last 30 days:  No results found.  Assessment/Plan 1. Seasonal allergies - c/o itching eyes and runny nose - eyes appear watery, nasal drainage clear - will do 2 week trial of Claritin 10 mg po QHS, then restart zyrtec 10 mg po QHS - may continue eye drops for itching- wife supplied  2. 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 Benign prostatic hyperplasia, unspecified whether lower urinary tract symptoms present - stable with flomax  4. Depression, recurrent ([place]) - stable with  cymbalta  5. Hyperlipidemia, unspecified hyperlipidemia type - stable with pravastatin  6. Idiopathic peripheral neuropathy - stable with lyrica and cymbalta combination  7. Chronic midline low back pain without sciatica - chronic stenosis to L3-L-4, L4-L5 - history of L2 compression fracture - not a candidate for surgery per wife and advanced dementia - cont lyrica, scheduled tylenol and butrans regimen per neuro  8. Primary hypertension - bp at goal < 150/90 - cont losartan-HCTZ regimen    Family/ staff Communication: plan discussed with wife and nurse  Labs/tests ordered:  Valproic acid level

## 2020-11-11 ENCOUNTER — Non-Acute Institutional Stay: Payer: Medicare Other | Admitting: Student

## 2020-11-11 ENCOUNTER — Other Ambulatory Visit: Payer: Self-pay

## 2020-11-11 DIAGNOSIS — G300 Alzheimer's disease with early onset: Secondary | ICD-10-CM

## 2020-11-11 DIAGNOSIS — M545 Low back pain, unspecified: Secondary | ICD-10-CM

## 2020-11-11 DIAGNOSIS — Z515 Encounter for palliative care: Secondary | ICD-10-CM

## 2020-11-11 DIAGNOSIS — F02818 Dementia in other diseases classified elsewhere, unspecified severity, with other behavioral disturbance: Secondary | ICD-10-CM

## 2020-11-11 DIAGNOSIS — G8929 Other chronic pain: Secondary | ICD-10-CM

## 2020-11-11 DIAGNOSIS — F419 Anxiety disorder, unspecified: Secondary | ICD-10-CM

## 2020-11-11 NOTE — Progress Notes (Signed)
[facility] Pall[facility] Consult Note Telephone: [phone]  Fax: [phone]    Date of encounter: 11/11/20 PATIENT NAME: Juan Arroyo [address] [place] [address]   [phone] (home) [phone] (work) DOB: 11-06-1955 MRN: [id number] PRIMARY CARE PROVIDER:    Virgie Dad, MD,  [place] [address] [phone]  REFERRING PROVIDER:   Virgie Dad, MD [address] [place],  [place] [address] [phone]  RESPONSIBLE PARTY:    Contact Information    Name Relation Home Work [place] Spouse [phone]  [phone]       I met face to face with patient in the facility. Palliative Care was asked to follow this patient by consultation request of  Virgie Dad, MD to address advance care planning and complex medical decision making. This is a follow up visit.                                   ASSESSMENT AND PLAN / RECOMMENDATIONS:   Advance Care Planning/Goals of Care: Goals include to maximize quality of life and symptom management. Our advance care planning conversation included a discussion about:     The value and importance of advance care planning   Experiences with loved ones who have been seriously ill or have died   Exploration of personal, cultural or spiritual beliefs that might influence medical decisions   Exploration of goals of care in the event of a sudden injury or illness   Identification and preparation of a healthcare agent   Review and updating or creation of an  advance directive document .  Decision not to resuscitate or to de-escalate disease focused treatments due to poor prognosis.  CODE STATUS: DNR  Symptom Management/Plan:  Pain-patient with chronic low back pain. Continue buprenophrinepatch83mg/hr, change weekly on Fridays; lidocaine 4% patch apply to affected area (lower back) daily PRN, on 12 hours/off 12 hours. duloxetine 6227mdaily,  meloxicam 7.27m527maily,Lyrica 100m51mD, acetaminophen 1000mg58m. Staff to assist with adl's.  Alzheimer's dementia-FAST score 6D. Patient requires assistance with adl's. Recommend: reorient/redirect as needed. Monitor for safety and falls, wander guard in place. Continue ST as directed. Continue memantine 10mg 24my,donepezil 20mg d7m.Anxiety/agitation secondary to dementia-continue lorazepam 0.27mg dai11mand 0.27mg QD P36m   Follow up Palliative Care Visit: Palliative care will continue to follow for complex medical decision making, advance care planning, and clarification of goals. Return in 4 weeks or prn.  I spent 25 minutes providing this consultation. More than 50% of the time in this consultation was spent in counseling and care coordination.   PPS: 50%  HOSPICE ELIGIBILITY/DIAGNOSIS: TBD  Chief Complaint: Palliative Medicine follow up visit; dementia.  HISTORY OF PRESENT ILLNESS:  Juan Arroyo.o. y89r old male  withAlzheimer's Dementia, chronic low back pain. Diagnoses also include hypertension, hyperlipidemia.  Patient reports doing well. He states his back pain has improved. Nursing staff also report pain being managed better, less complaints of pain and lidoderm patch has been helpful. Good appetite endorsed; ST is currently working with patient on hand held foods, memory book/tasks. Patient receiving scheduled lorazepam and this has been helpful for his anxiety/agitation. He will attempt to ambulate unassisted per staff. He is monitored for falls; no falls reported since last visit. No recent ER visits or hospitalizations.    History obtained from review of EMR, discussion with primary  team, and interview with family, facility staff/caregiver and/or Mr. Axtman.  I reviewed available labs, medications, imaging, studies and related documents from the EMR.  Records reviewed and summarized above.   ROS  General: NAD EYES: denies vision changes ENMT: denies  dysphagia Cardiovascular: denies chest pain Pulmonary: denies cough, denies increased SOB Abdomen: endorses good appetite, denies constipation GU: denies dysuria MSK: no falls reported Skin: denies rashes or wounds Neurological: denies pain, denies insomnia Psych: Endorses positive mood Heme/lymph/immuno: denies bruises, abnormal bleeding  Physical Exam: Constitutional: NAD General: frail appearing EYES: anicteric sclera, lids intact, no discharge  ENMT: intact hearing, oral mucous membranes moist CV: S1S2, RRR, no LE edema Pulmonary: LCTA, no increased work of breathing, no cough, room air Abdomen: normo-active BS + 4 quadrants, soft and non tender GU: deferred MSK: ambulatory Skin: warm and dry, no rashes or wounds on visible skin Neuro: generalized weakness Psych: non-anxious affect Hem/lymph/immuno: no widespread bruising   Thank you for the opportunity to participate in the care of Mr. Marasigan.  The palliative care team will continue to follow. Please call our office at [phone] if we can be of additional assistance.   Ezekiel Slocumb, NP   COVID-19 PATIENT SCREENING TOOL Asked and negative response unless otherwise noted:   Have you had symptoms of covid, tested positive or been in contact with someone with symptoms/positive test in the past 5-10 days? No

## 2020-11-23 ENCOUNTER — Non-Acute Institutional Stay (SKILLED_NURSING_FACILITY): Payer: Medicare Other | Admitting: Nurse Practitioner

## 2020-11-23 ENCOUNTER — Encounter: Payer: Self-pay | Admitting: Nurse Practitioner

## 2020-11-23 DIAGNOSIS — J302 Other seasonal allergic rhinitis: Secondary | ICD-10-CM

## 2020-11-23 DIAGNOSIS — F339 Major depressive disorder, recurrent, unspecified: Secondary | ICD-10-CM

## 2020-11-23 DIAGNOSIS — F02818 Dementia in other diseases classified elsewhere, unspecified severity, with other behavioral disturbance: Secondary | ICD-10-CM

## 2020-11-23 DIAGNOSIS — G300 Alzheimer's disease with early onset: Secondary | ICD-10-CM | POA: Diagnosis not present

## 2020-11-23 DIAGNOSIS — E785 Hyperlipidemia, unspecified: Secondary | ICD-10-CM

## 2020-11-23 DIAGNOSIS — I10 Essential (primary) hypertension: Secondary | ICD-10-CM

## 2020-11-23 DIAGNOSIS — M545 Low back pain, unspecified: Secondary | ICD-10-CM

## 2020-11-23 DIAGNOSIS — G8929 Other chronic pain: Secondary | ICD-10-CM

## 2020-11-23 DIAGNOSIS — G609 Hereditary and idiopathic neuropathy, unspecified: Secondary | ICD-10-CM

## 2020-11-23 DIAGNOSIS — N400 Enlarged prostate without lower urinary tract symptoms: Secondary | ICD-10-CM

## 2020-11-23 DIAGNOSIS — F0281 Dementia in other diseases classified elsewhere with behavioral disturbance: Secondary | ICD-10-CM

## 2020-11-23 NOTE — Assessment & Plan Note (Signed)
Chronic lower back pain without sciatica, takes Pregabalin, Meloxicam,  Buprenorphine, Tylenol. 

## 2020-11-23 NOTE — Assessment & Plan Note (Signed)
Alzheimer's dementia, takes Memantine, Donepezil, sundowning behavioral issues, agitation, started Zyperxa 5mg  qhs 10/07/20, Depakote 125mg  bid 10/14/20,  prn Lorazepam. MRI showed severe atrophy and small vessel ischemia, f/u [facility].

## 2020-11-23 NOTE — Assessment & Plan Note (Signed)
takes Pravastatin, LDL 109 09/13/20 °

## 2020-11-23 NOTE — Assessment & Plan Note (Signed)
takes Losartan/HCTZ, Bun/creat 21/1.2 09/13/20  

## 2020-11-23 NOTE — Assessment & Plan Note (Signed)
takes Cymbalta, Lorazepam, TSH 1.46 09/13/20  

## 2020-11-23 NOTE — Assessment & Plan Note (Signed)
Seasonal allergies, antihistamine. 

## 2020-11-23 NOTE — Progress Notes (Signed)
Location:   [facility] Room Number: [place] of Service:  SNF (31) Provider:  Jamarl Pew Otho Darner, NP    Patient Care Team: Virgie Dad, MD as PCP - General (Internal Medicine)  Extended Emergency Contact Information Primary Emergency Contact: Prashad,Donna Address: [address]          [place], [place] [address] Johnnette Litter of [place] Phone: [phone] Mobile Phone: [phone] Relation: Spouse  Code Status: DNR  Goals of care: Advanced Directive information Advanced Directives 11/23/2020  Does Patient Have a Medical Advance Directive? Yes  Type of Paramedic of [place];Living will;Out of facility DNR (pink MOST or yellow form)  Does patient want to make changes to medical advance directive? No - Patient declined  Copy of [place] in Chart? Yes - validated most recent copy scanned in chart (See row information)  Would patient like information on creating a medical advance directive? -  Pre-existing out of facility DNR order (yellow form or pink MOST form) Yellow form placed in chart (order not valid for inpatient use);Pink MOST form placed in chart (order not valid for inpatient use)     Chief Complaint  Patient presents with  . Health Maintenance    Discuss the need for an HIV screening, hepatitis screening, colonoscopy, PNA vaccine, and TD/Tdap vaccine.   . Medical Management of Chronic Issues    Routine follow up.     HPI:  Pt is a 65 y.o. male seen today for medical management of chronic diseases.      HTN, takes Losartan/HCTZ, Bun/creat 21/1.2 09/13/20             Chronic lower back pain without sciatica, takes Pregabalin, Meloxicam,  Buprenorphine, Tylenol.              Peripheral neuropathy, takes Pregabalin             Hyperlipidemia, takes Pravastatin, LDL 109 09/13/20             Depression, takes Cymbalta, Lorazepam, TSH 1.46 09/13/20             BPH takes Tamsulosin             Alzheimer's  dementia, takes Memantine, Donepezil, sundowning behavioral issues, agitation, started Zyperxa 5mg  qhs 10/07/20, Depakote 125mg  bid 10/14/20,  prn Lorazepam. MRI showed severe atrophy and small vessel ischemia, f/u [facility]   Seasonal allergies, antihistamine.                 Past Medical History:  Diagnosis Date  . Hyperlipidemia   . Hypertension   . Seasonal allergies    Past Surgical History:  Procedure Laterality Date  . HAMMERTOE RECONSTRUCTION WITH WEIL OSTEOTOMY  08/01/2012   Procedure: HAMMERTOE RECONSTRUCTION WITH WEIL OSTEOTOMY;  Surgeon: Wylene Simmer, MD;  Location: [facility];  Service: Orthopedics;  Laterality: Right;  RIGHT SCARF OSTEOTOMY, MODIFIED MCBRIDE BUNIONECTOMY, , RIGHT 2ND - 4TH WEIL, RIGHT 2ND - 5TH DORSAL CAPSULOTOMIES, RIGHT 2ND - 4TH PIP ARTHRODESIS  . LIPOMA EXCISION  08/01/2012   Procedure: EXCISION LIPOMA;  Surgeon: Rolm Bookbinder, MD;  Location: [facility];  Service: General;  Laterality: N/A;  EXCISION LIPOMAS X 6 (LEFT TRICEP, RIGHT TRICEP, DELTOID, BIL ABDOMINAL WALL AND LEFT THIGH)  . NASAL SINUS SURGERY    . SHOULDER SURGERY     right    No Known Allergies  Allergies as of 11/23/2020   No Known Allergies  Medication List       Accurate as of Nov 23, 2020 11:59 PM. If you have any questions, ask your nurse or doctor.        acetaminophen 500 MG tablet Commonly known as: TYLENOL Take 1,000 mg by mouth 3 (three) times daily.   buprenorphine 10 MCG/HR Ptwk Commonly known as: BUTRANS Place 1 patch onto the skin once a week.   cetirizine 10 MG tablet Commonly known as: ZYRTEC Take 10 mg by mouth daily.   divalproex 125 MG DR tablet Commonly known as: DEPAKOTE Take 125 mg by mouth 2 (two) times daily.   donepezil 10 MG tablet Commonly known as: ARICEPT Take 20 mg by mouth every morning.   DULoxetine 60 MG capsule Commonly known as: CYMBALTA Take 60 mg by mouth 2 (two) times daily.   Lidocaine 4 %  Ptch Apply 1 patch topically daily as needed.   LORazepam 0.5 MG tablet Commonly known as: ATIVAN Take 0.5 mg by mouth daily.   losartan-hydrochlorothiazide 100-12.5 MG tablet Commonly known as: HYZAAR Take 1 tablet by mouth daily.   meloxicam 7.5 MG tablet Commonly known as: MOBIC Take 7.5 mg by mouth daily.   memantine 10 MG tablet Commonly known as: NAMENDA Take 10 mg by mouth 2 (two) times daily.   OLANZapine 5 MG tablet Commonly known as: ZYPREXA Take 5 mg by mouth daily.   omeprazole 20 MG capsule Commonly known as: PRILOSEC Take 20 mg by mouth daily.   pravastatin 40 MG tablet Commonly known as: PRAVACHOL Take 40 mg by mouth daily.   pregabalin 100 MG capsule Commonly known as: LYRICA Take 100 mg by mouth 2 (two) times daily.   tamsulosin 0.4 MG Caps capsule Commonly known as: FLOMAX Take 0.4 mg by mouth daily.   zinc oxide 20 % ointment Apply 1 application topically as needed for irritation.       Review of Systems  Constitutional: Negative for fatigue, fever and unexpected weight change.  HENT: Negative for congestion, hearing loss and trouble swallowing.   Eyes: Negative for visual disturbance.  Respiratory: Negative for cough and shortness of breath.   Cardiovascular: Negative for leg swelling.  Gastrointestinal: Negative for abdominal pain, constipation, nausea and vomiting.  Genitourinary: Negative for dysuria and urgency.  Musculoskeletal: Positive for arthralgias, back pain and gait problem.  Skin: Negative for color change.  Neurological: Negative for tremors and headaches.       Dementia, ROS was provided with assistance of staff.   Psychiatric/Behavioral: Positive for agitation, behavioral problems and confusion. Negative for sleep disturbance. The patient is not nervous/anxious.     Immunization History  Administered Date(s) Administered  . Influenza Split 06/09/2015, 04/04/2016, 02/27/2017  . Influenza,inj,Quad PF,6-35 Mos 04/03/2019,  04/06/2020  . PFIZER(Purple Top)SARS-COV-2 Vaccination 08/25/2019, 09/16/2019, 05/03/2020  . Tdap 08/01/2010  . Zoster 08/24/2016   Pertinent  Health Maintenance Due  Topic Date Due  . COLONOSCOPY (Pts 45-70yrs Insurance coverage will need to be confirmed)  Never done  . PNA vac Low Risk Adult (1 of 2 - PCV13) Never done  . INFLUENZA VACCINE  02/07/2021   Fall Risk  03/12/2015 03/01/2015  Falls in the past year? No No  Risk for fall due to : Other (Comment) -   Functional Status Survey:    Vitals:   11/23/20 1338  BP: (!) 143/94  Pulse: 67  Resp: (!) 80  Temp: 98.2 F (36.8 C)  SpO2: 96%  Weight: 220 lb (99.8 kg)  Height: 6'  4" (1.93 m)   Body mass index is 26.78 kg/m. Physical Exam Vitals and nursing note reviewed.  Constitutional:      Appearance: Normal appearance.  HENT:     Head: Normocephalic and atraumatic.     Mouth/Throat:     Mouth: Mucous membranes are moist.  Eyes:     Extraocular Movements: Extraocular movements intact.     Conjunctiva/sclera: Conjunctivae normal.     Pupils: Pupils are equal, round, and reactive to light.  Cardiovascular:     Rate and Rhythm: Normal rate and regular rhythm.     Heart sounds: No murmur heard.   Pulmonary:     Effort: Pulmonary effort is normal.     Breath sounds: No rales.  Abdominal:     General: Bowel sounds are normal.     Palpations: Abdomen is soft.     Tenderness: There is no abdominal tenderness.  Musculoskeletal:        General: Normal range of motion.     Cervical back: Normal range of motion and neck supple.     Right lower leg: No edema.     Left lower leg: No edema.  Skin:    General: Skin is warm and dry.  Neurological:     General: No focal deficit present.     Mental Status: He is alert. Mental status is at baseline.     Motor: No weakness.     Coordination: Coordination normal.     Gait: Gait abnormal.     Comments: Oriented to person  Psychiatric:     Comments: Followed simple directions  during my examination.      Labs reviewed: Recent Labs    09/13/20 0000  NA 143  K 3.8  CL 108  CO2 29*  BUN 21  CREATININE 1.2  CALCIUM 9.4   Recent Labs    09/13/20 0000  AST 24  ALT 41*  ALKPHOS 65  ALBUMIN 3.9   Recent Labs    09/13/20 0000 10/28/20 0000  WBC 6.5 5.6  NEUTROABS 4,264.00  --   HGB 15.5 15.7  HCT 46 48  PLT 246 316   Lab Results  Component Value Date   TSH 1.46 09/13/2020   No results found for: HGBA1C Lab Results  Component Value Date   CHOL 177 09/13/2020   HDL 45 09/13/2020   LDLCALC 109 09/13/2020    Significant Diagnostic Results in last 30 days:  No results found.  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 MRI showed severe atrophy and small vessel ischemia, f/u [facility].    Seasonal allergies Seasonal allergies, antihistamine.   BPH (benign prostatic hyperplasia)  takes Tamsulosin   Depression, recurrent (HCC) takes Cymbalta, Lorazepam, TSH 1.46 09/13/20   Hyperlipidemia takes Pravastatin, LDL 109 09/13/20   Peripheral neuropathy  Peripheral neuropathy, takes Pregabalin  Chronic lower back pain Chronic lower back pain without sciatica, takes Pregabalin, Meloxicam,  Buprenorphine, Tylenol.    HTN (hypertension) takes Losartan/HCTZ, Bun/creat 21/1.2 09/13/20      Family/ staff Communication: plan of care reviewed with the patient and charge nurse.   Labs/tests ordered:  none  Time spend 35 minutes.

## 2020-11-23 NOTE — Assessment & Plan Note (Signed)
Peripheral neuropathy, takes Pregabalin

## 2020-11-23 NOTE — Assessment & Plan Note (Signed)
takes Tamsulosin 

## 2020-12-09 LAB — CBC AND DIFFERENTIAL
HCT: 46 (ref 41–53)
Hemoglobin: 15.8 (ref 13.5–17.5)
Platelets: 298 (ref 150–399)
WBC: 6

## 2020-12-09 LAB — HEPATIC FUNCTION PANEL
ALT: 23 (ref 10–40)
AST: 15 (ref 14–40)
Alkaline Phosphatase: 63 (ref 25–125)
Bilirubin, Direct: 0.3 (ref 0.01–0.4)
Bilirubin, Total: 1.2

## 2020-12-09 LAB — CBC: RBC: 5.05 (ref 3.87–5.11)

## 2020-12-09 LAB — COMPREHENSIVE METABOLIC PANEL
Albumin: 4.5 (ref 3.5–5.0)
Globulin: 2.3

## 2020-12-14 LAB — COMPREHENSIVE METABOLIC PANEL
Albumin: 4.6 (ref 3.5–5.0)
Globulin: 2.4

## 2020-12-14 LAB — CBC: RBC: 5.28 — AB (ref 3.87–5.11)

## 2020-12-14 LAB — CBC AND DIFFERENTIAL
HCT: 47 (ref 41–53)
Hemoglobin: 15.5 (ref 13.5–17.5)
Platelets: 319 (ref 150–399)
WBC: 7.5

## 2020-12-14 LAB — HEPATIC FUNCTION PANEL
ALT: 20 (ref 10–40)
AST: 16 (ref 14–40)
Alkaline Phosphatase: 62 (ref 25–125)
Bilirubin, Direct: 0.2 (ref 0.01–0.4)
Bilirubin, Total: 0.9

## 2020-12-28 ENCOUNTER — Non-Acute Institutional Stay: Payer: Medicare Other | Admitting: Student

## 2020-12-28 DIAGNOSIS — R52 Pain, unspecified: Secondary | ICD-10-CM

## 2020-12-28 DIAGNOSIS — F419 Anxiety disorder, unspecified: Secondary | ICD-10-CM

## 2020-12-28 DIAGNOSIS — F0281 Dementia in other diseases classified elsewhere with behavioral disturbance: Secondary | ICD-10-CM

## 2020-12-28 DIAGNOSIS — Z515 Encounter for palliative care: Secondary | ICD-10-CM

## 2020-12-28 DIAGNOSIS — G300 Alzheimer's disease with early onset: Secondary | ICD-10-CM

## 2020-12-28 NOTE — Progress Notes (Signed)
[facility] Palliative Care Consult Note Telephone: [phone]  Fax: [phone]    Date of encounter: 12/28/20 PATIENT NAME: Juan Arroyo [address] [place] [address]   [phone] (home) [phone] (work) DOB: October 25, 1955 MRN: [id number] PRIMARY CARE PROVIDER:    Virgie Dad, MD,  [place] [address] [phone]  REFERRING PROVIDER:   Virgie Dad, MD [address] [place],  [place] [address] [phone]  RESPONSIBLE PARTY:    Contact Information     Name Relation Home Work Juan Arroyo Spouse [phone]  [phone]        I met face to face with patient in the facility. Palliative Care was asked to follow this patient by consultation request of  Juan Dad, MD to address advance care planning and complex medical decision making. This is a follow up visit.                                   ASSESSMENT AND PLAN / RECOMMENDATIONS:   Advance Care Planning/Goals of Care: Goals include to maximize quality of life and symptom management. Our advance care planning conversation included a discussion about:    The value and importance of advance care planning  Experiences with loved ones who have been seriously ill or have died  Exploration of personal, cultural or spiritual beliefs that might influence medical decisions  Exploration of goals of care in the event of a sudden injury or illness  MOST form in place CODE STATUS: DNR  Symptom Management/Plan:  Alzheimer's dementia-FAST score 6D. Patient requires assistance with adl's. Monitor for changes/decline in physical functioning. Recommend: reorient/redirect as needed, assist with adl's. Monitor for safety and falls, wander guard in place. Continue memantine 53m daily, donepezil 280mdaily. Anxiety/agitation secondary to dementia-continue routine olanzapine, Depakote, PRN lorazepam as directed.  Pain-patient with chronic low  back pain. Continue  buprenophrine patch1075mhr, change weekly on Fridays; lidocaine 4% patch apply to affected area (lower back) daily PRN, on 12 hours/off 12 hours. duloxetine 48m28mily, meloxicam 7.5mg 62mly, Lyrica 100mg 45m acetaminophen 1000mg T36mStaff to assist with adl's.    Follow up Palliative Care Visit: Palliative care will continue to follow for complex medical decision making, advance care planning, and clarification of goals. Return in 8 weeks or prn.  I spent 25 minutes providing this consultation. More than 50% of the time in this consultation was spent in counseling and care coordination.   PPS: 40%  HOSPICE ELIGIBILITY/DIAGNOSIS: TBD  Chief Complaint: Palliative Medicine follow up visit.   HISTORY OF PRESENT ILLNESS:  Juan Tall4 y.o.31ear old male  with Alzheimer's Dementia, chronic low back pain. Diagnoses also include hypertension, hyperlipidemia.   [facility]. Nurse reports patient with increased weakness this morning, required more assistance standing. She reports fall without injury in the past 3 weeks. Patient answers direct questions. Denies pain, back pain. He is out of bed daily to w/c. Ambulates short distances with walker and assist. Nursing staff report behaviors in the evenings. No recent ER visits or hospitalizations.   History obtained from review of EMR, discussion with primary team, and interview with family, facility staff/caregiver and/or Juan Arroyo available labs, medications, imaging, studies and related documents from the EMR.  Records reviewed and summarized above.   ROS  General: NAD EYES:  denies vision changes ENMT: denies dysphagia Cardiovascular: denies chest pain Pulmonary: denies cough, denies increased SOB Abdomen: endorses good appetite, denies constipation GU: denies dysuria MSK: weakness Skin: denies rashes or wounds Neurological: denies pain, denies  insomnia Psych: Endorses positive mood Heme/lymph/immuno: denies bruises, abnormal bleeding  Physical Exam: Weight: 220.3 pounds Constitutional: NAD General: frail appearing EYES: anicteric sclera, lids intact, no discharge  ENMT: intact hearing, oral mucous membranes moist CV: S1S2, RRR, trace LE edema; compression hose on Pulmonary: LCTA, no increased work of breathing, no cough, room air Abdomen: normo-active BS + 4 quadrants, soft and non tender GU: deferred MSK: moves all extremities, ambulatory Skin: warm and dry, no rashes or wounds on visible skin Neuro: generalized weakness, A & O to person, forgetful; able to follow simple commands/directions Psych: non-anxious affect, pleasant Hem/lymph/immuno: no widespread bruising   Thank you for the opportunity to participate in the care of Juan Arroyo.  The palliative care team will continue to follow. Please call our office at [phone] if we can be of additional assistance.   Juan Slocumb, NP   COVID-19 PATIENT SCREENING TOOL Asked and negative response unless otherwise noted:   Have you had symptoms of covid, tested positive or been in contact with someone with symptoms/positive test in the past 5-10 days? No

## 2020-12-30 ENCOUNTER — Other Ambulatory Visit: Payer: Self-pay

## 2020-12-30 ENCOUNTER — Non-Acute Institutional Stay (SKILLED_NURSING_FACILITY): Payer: Medicare Other | Admitting: Internal Medicine

## 2020-12-30 ENCOUNTER — Encounter: Payer: Self-pay | Admitting: Internal Medicine

## 2020-12-30 DIAGNOSIS — F0281 Dementia in other diseases classified elsewhere with behavioral disturbance: Secondary | ICD-10-CM

## 2020-12-30 DIAGNOSIS — G609 Hereditary and idiopathic neuropathy, unspecified: Secondary | ICD-10-CM

## 2020-12-30 DIAGNOSIS — E785 Hyperlipidemia, unspecified: Secondary | ICD-10-CM

## 2020-12-30 DIAGNOSIS — G8929 Other chronic pain: Secondary | ICD-10-CM

## 2020-12-30 DIAGNOSIS — I10 Essential (primary) hypertension: Secondary | ICD-10-CM

## 2020-12-30 DIAGNOSIS — F339 Major depressive disorder, recurrent, unspecified: Secondary | ICD-10-CM

## 2020-12-30 DIAGNOSIS — N400 Enlarged prostate without lower urinary tract symptoms: Secondary | ICD-10-CM | POA: Diagnosis not present

## 2020-12-30 DIAGNOSIS — M545 Low back pain, unspecified: Secondary | ICD-10-CM

## 2020-12-30 DIAGNOSIS — G300 Alzheimer's disease with early onset: Secondary | ICD-10-CM | POA: Diagnosis not present

## 2020-12-30 NOTE — Progress Notes (Signed)
Location:   [facility] Room Number: 8 Place of Service:  SNF [phone]) Provider:  Veleta Miners MD   Virgie Dad, MD  Patient Care Team: Virgie Dad, MD as PCP - General (Internal Medicine)  Extended Emergency Contact Information Primary Emergency Contact: Rizo,Donna Address: [address]          [place], [place] [address] Johnnette Litter of [place] Phone: [phone] Mobile Phone: [phone] Relation: Spouse  Code Status:  DNR Palliative Care Goals of care: Advanced Directive information Advanced Directives 12/30/2020  Does Patient Have a Medical Advance Directive? Yes  Type of Paramedic of [place];Living will;Out of facility DNR (pink MOST or yellow form)  Does patient want to make changes to medical advance directive? No - Patient declined  Copy of [place] in Chart? Yes - validated most recent copy scanned in chart (See row information)  Would patient like information on creating a medical advance directive? -  Pre-existing out of facility DNR order (yellow form or pink MOST form) Yellow form placed in chart (order not valid for inpatient use);Pink MOST form placed in chart (order not valid for inpatient use)     Chief Complaint  Patient presents with   Medical Management of Chronic Issues   Health Maintenance    HIV screening, Hep C screening, Colonoscopy, Shingrix, PCV13, TDAP    HPI:  Pt is a 65 y.o. male seen today for medical management of chronic diseases.    Patient has a history of Alzheimer's dementia with behavioral disturbance.  Has had extensive work-up in [facility]. Was diagnosed at age of 63.  Had MRI done which showed severe atrophy and small vessel ischemia.  Has been on Aricept and Namenda. His wife who is the main caregiver has moved him here due to his underlying physical decline and cognitive decline Has history of chronic back pain MRI has shown lumbar stenosis lat  L3-4, L4-5. Prior L2 compression fracture On high doses of Lyrica and started on Butrans per Neurology  His Acute problem seem to be Behavior issue. He gets Anxious  in afternoon and tries to walk without his walker. He has had multiple falls due to that. No Injuries. Has gained weight.  No other issues per Nurses. Has aphasia. Does Follows commands.   Past Medical History:  Diagnosis Date   Hyperlipidemia    Hypertension    Seasonal allergies    Past Surgical History:  Procedure Laterality Date   HAMMERTOE RECONSTRUCTION WITH WEIL OSTEOTOMY  08/01/2012   Procedure: HAMMERTOE RECONSTRUCTION WITH WEIL OSTEOTOMY;  Surgeon: Wylene Simmer, MD;  Location: [facility];  Service: Orthopedics;  Laterality: Right;  RIGHT SCARF OSTEOTOMY, MODIFIED MCBRIDE BUNIONECTOMY, , RIGHT 2ND - 4TH WEIL, RIGHT 2ND - 5TH DORSAL CAPSULOTOMIES, RIGHT 2ND - 4TH PIP ARTHRODESIS   LIPOMA EXCISION  08/01/2012   Procedure: EXCISION LIPOMA;  Surgeon: Rolm Bookbinder, MD;  Location: [facility];  Service: General;  Laterality: N/A;  EXCISION LIPOMAS X 6 (LEFT TRICEP, RIGHT TRICEP, DELTOID, BIL ABDOMINAL WALL AND LEFT THIGH)   NASAL SINUS SURGERY     SHOULDER SURGERY     right    No Known Allergies  Allergies as of 12/30/2020   No Known Allergies      Medication List        Accurate as of December 30, 2020  3:43 PM. If you have any questions, ask your nurse or doctor.  acetaminophen 500 MG tablet Commonly known as: TYLENOL Take 1,000 mg by mouth 3 (three) times daily.   buprenorphine 10 MCG/HR Ptwk Commonly known as: BUTRANS Place 1 patch onto the skin once a week.   cetirizine 10 MG tablet Commonly known as: ZYRTEC Take 10 mg by mouth daily.   divalproex 125 MG DR tablet Commonly known as: DEPAKOTE Take 125 mg by mouth 2 (two) times daily.   donepezil 10 MG tablet Commonly known as: ARICEPT Take 20 mg by mouth every morning.   DULoxetine 60 MG  capsule Commonly known as: CYMBALTA Take 60 mg by mouth 2 (two) times daily.   Lidocaine 4 % Ptch Apply 1 patch topically daily as needed.   LORazepam 0.5 MG tablet Commonly known as: ATIVAN Take 0.5 mg by mouth daily.   losartan-hydrochlorothiazide 100-12.5 MG tablet Commonly known as: HYZAAR Take 1 tablet by mouth daily.   meloxicam 7.5 MG tablet Commonly known as: MOBIC Take 7.5 mg by mouth daily.   memantine 10 MG tablet Commonly known as: NAMENDA Take 10 mg by mouth 2 (two) times daily.   OLANZapine 5 MG tablet Commonly known as: ZYPREXA Take 5 mg by mouth daily.   omeprazole 20 MG capsule Commonly known as: PRILOSEC Take 20 mg by mouth daily.   pravastatin 40 MG tablet Commonly known as: PRAVACHOL Take 40 mg by mouth daily.   pregabalin 100 MG capsule Commonly known as: LYRICA Take 100 mg by mouth 2 (two) times daily.   tamsulosin 0.4 MG Caps capsule Commonly known as: FLOMAX Take 0.4 mg by mouth daily.   zinc oxide 20 % ointment Apply 1 application topically as needed for irritation.        Review of Systems  Unable to perform ROS: Dementia   Immunization History  Administered Date(s) Administered   Influenza Split 06/09/2015, 04/04/2016, 02/27/2017   Influenza,inj,Quad PF,6-35 Mos 04/03/2019, 04/06/2020   PFIZER Comirnaty(Gray Top)Covid-19 Tri-Sucrose Vaccine 12/15/2020   PFIZER(Purple Top)SARS-COV-2 Vaccination 08/25/2019, 09/16/2019, 05/03/2020   Tdap 08/01/2010   Zoster, Live 08/24/2016   Pertinent  Health Maintenance Due  Topic Date Due   COLONOSCOPY (Pts 45-2yrs Insurance coverage will need to be confirmed)  Never done   PNA vac Low Risk Adult (1 of 2 - PCV13) Never done   INFLUENZA VACCINE  02/07/2021   Fall Risk  03/12/2015 03/01/2015  Falls in the past year? No No  Risk for fall due to : Other (Comment) -   Functional Status Survey:    Vitals:   12/30/20 1534  BP: (!) 152/98  Pulse: 68  Resp: 20  Temp: 97.7 F (36.5 C)   SpO2: 99%  Weight: 220 lb 4.8 oz (99.9 kg)  Height: 6\' 4"  (1.93 m)   Body mass index is 26.82 kg/m. Physical Exam Constitutional: . Well-developed and well-nourished.  HENT:  Head: Normocephalic.  Mouth/Throat: Oropharynx is clear and moist.  Eyes: Pupils are equal, round, and reactive to light.  Neck: Neck supple.  Cardiovascular: Normal rate and normal heart sounds.  No murmur heard. Pulmonary/Chest: Effort normal and breath sounds normal. No respiratory distress. No wheezes. She has no rales.  Abdominal: Soft. Bowel sounds are normal. No distension. There is no tenderness. There is no rebound.  Musculoskeletal: Mild Edema Bilateral Lymphadenopathy: none Neurological: No Focal Deficits Forgets to use his walker. Has unstable Gait. Stays High Risk for Falls.  Does have Aphasia Skin: Skin is warm and dry.  Psychiatric: Behavior issues in Afternoon per Nurses Labs reviewed: Recent  Labs    09/13/20 0000  NA 143  K 3.8  CL 108  CO2 29*  BUN 21  CREATININE 1.2  CALCIUM 9.4   Recent Labs    09/13/20 0000 12/09/20 0000 12/14/20 0000  AST 24 15 16   ALT 41* 23 20  ALKPHOS 65 63 62  ALBUMIN 3.9 4.5 4.6   Recent Labs    09/13/20 0000 10/28/20 0000 12/09/20 0000 12/14/20 0000  WBC 6.5 5.6 6.0 7.5  NEUTROABS 4,264.00  --   --   --   HGB 15.5 15.7 15.8 15.5  HCT 46 48 46 47  PLT 246 316 298 319   Lab Results  Component Value Date   TSH 1.46 09/13/2020   No results found for: HGBA1C Lab Results  Component Value Date   CHOL 177 09/13/2020   HDL 45 09/13/2020   LDLCALC 109 09/13/2020    Significant Diagnostic Results in last 30 days:  No results found.  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

## 2021-01-06 LAB — HEPATIC FUNCTION PANEL
ALT: 21 (ref 10–40)
AST: 17 (ref 14–40)
Alkaline Phosphatase: 51 (ref 25–125)
Bilirubin, Total: 0.9

## 2021-01-06 LAB — COMPREHENSIVE METABOLIC PANEL
Albumin: 3.8 (ref 3.5–5.0)
Globulin: 1.7

## 2021-01-24 ENCOUNTER — Non-Acute Institutional Stay (SKILLED_NURSING_FACILITY): Payer: Medicare Other | Admitting: Orthopedic Surgery

## 2021-01-24 ENCOUNTER — Encounter: Payer: Self-pay | Admitting: Orthopedic Surgery

## 2021-01-24 DIAGNOSIS — G8929 Other chronic pain: Secondary | ICD-10-CM

## 2021-01-24 DIAGNOSIS — N400 Enlarged prostate without lower urinary tract symptoms: Secondary | ICD-10-CM

## 2021-01-24 DIAGNOSIS — R634 Abnormal weight loss: Secondary | ICD-10-CM

## 2021-01-24 DIAGNOSIS — F339 Major depressive disorder, recurrent, unspecified: Secondary | ICD-10-CM

## 2021-01-24 DIAGNOSIS — F02818 Dementia in other diseases classified elsewhere, unspecified severity, with other behavioral disturbance: Secondary | ICD-10-CM

## 2021-01-24 DIAGNOSIS — M545 Low back pain, unspecified: Secondary | ICD-10-CM

## 2021-01-24 DIAGNOSIS — F0281 Dementia in other diseases classified elsewhere with behavioral disturbance: Secondary | ICD-10-CM

## 2021-01-24 DIAGNOSIS — G609 Hereditary and idiopathic neuropathy, unspecified: Secondary | ICD-10-CM | POA: Diagnosis not present

## 2021-01-24 DIAGNOSIS — G300 Alzheimer's disease with early onset: Secondary | ICD-10-CM

## 2021-01-24 DIAGNOSIS — I10 Essential (primary) hypertension: Secondary | ICD-10-CM

## 2021-01-24 DIAGNOSIS — E785 Hyperlipidemia, unspecified: Secondary | ICD-10-CM

## 2021-01-24 LAB — CBC AND DIFFERENTIAL
HCT: 44 (ref 41–53)
Hemoglobin: 14.9 (ref 13.5–17.5)
Platelets: 279 (ref 150–399)
WBC: 6.3

## 2021-01-24 LAB — CBC: RBC: 4.86 (ref 3.87–5.11)

## 2021-01-24 LAB — HEPATIC FUNCTION PANEL
ALT: 16 (ref 10–40)
AST: 15 (ref 14–40)
Alkaline Phosphatase: 57 (ref 25–125)
Bilirubin, Direct: 0.2 (ref 0.01–0.4)
Bilirubin, Total: 0.9

## 2021-01-24 LAB — COMPREHENSIVE METABOLIC PANEL
Albumin: 3.7 (ref 3.5–5.0)
Globulin: 1.9

## 2021-01-24 NOTE — Progress Notes (Signed)
Location:   [place] Room Number: [place] of Service:  SNF [phone]) Provider:  Windell Moulding, NP    Patient Care Team: Virgie Dad, MD as PCP - General (Internal Medicine)  Extended Emergency Contact Information Primary Emergency Contact: Beagley,Donna Address: [address]          [place], [place] [address] Johnnette Litter of [place] Phone: [phone] Mobile Phone: [phone] Relation: Spouse  Code Status:  DNR Goals of care: Advanced Directive information Advanced Directives 01/24/2021  Does Patient Have a Medical Advance Directive? Yes  Type of Paramedic of [place];Living will;Out of facility DNR (pink MOST or yellow form)  Does patient want to make changes to medical advance directive? No - Patient declined  Copy of [place] in Chart? Yes - validated most recent copy scanned in chart (See row information)  Would patient like information on creating a medical advance directive? -  Pre-existing out of facility DNR order (yellow form or pink MOST form) Yellow form placed in chart (order not valid for inpatient use);Pink MOST form placed in chart (order not valid for inpatient use)     Chief Complaint  Patient presents with   Medical Management of Chronic Issues    Routine follow up    Health Maintenance    Discuss need for HIV screening,hepatitis c screening, colonoscopy, shingles vaccine, PNA vaccine, and td/tdap vaccine.     HPI:  Pt is a 65 y.o. male seen today for medical management of chronic diseases.    He continues to reside on the skilled nursing unit at [facility] due to Alzheimer's dementia. Past medical history includes: HTN, peripheral neuropathy, BPH, chronic back pain, depression HLD, seasonal allergies, and hyperbilirubinemia.   Alzheimer's, 07/14 he was seen by [facility] due to increased behaviors. He was taken off Zyprexa, advised to continue Depakote, Aricept, ativan  and namenda regimen. 07/16 he was found sitting on the floor eating dinner. No other behavioral outbursts reported. He remains very pleasant and follow commands. AST/ALT 16/20 06/07/20222.  HTN, BUN/creat 21/1.2 09/13/2020, losartan-HCTZ 100-12.5 mg daily Chronic back pain, butrans patch/mobic/ tylenol/ Cymbalta/ lyrica regimen daily, denies back pain today Peripheral neuropathy, Cymbalta and lyrica regimen BPH, no retention, Flomax 0.4 mg daily HLD, LDL 109 09/13/2020, pravastatin 40 mg daily Hyperbilirubinemia, total bilirubin 0.9 12/14/2020  Remains on palliative service, last seen 06/21, no changes to medications.   2 falls this past week, no injuries.   Recent weights:  07/14- 212.3 lbs  06/01- 220.3 lbs  05/03- 220 lbs  Recent blood pressures:   07/17- 157/87, 116/76  07/16- 134/70, 154/94  07/12- 130/84  Nurse does not report any other concerns, vitals stable.      Past Medical History:  Diagnosis Date   Hyperlipidemia    Hypertension    Seasonal allergies    Past Surgical History:  Procedure Laterality Date   HAMMERTOE RECONSTRUCTION WITH WEIL OSTEOTOMY  08/01/2012   Procedure: HAMMERTOE RECONSTRUCTION WITH WEIL OSTEOTOMY;  Surgeon: Wylene Simmer, MD;  Location: [facility];  Service: Orthopedics;  Laterality: Right;  RIGHT SCARF OSTEOTOMY, MODIFIED MCBRIDE BUNIONECTOMY, , RIGHT 2ND - 4TH WEIL, RIGHT 2ND - 5TH DORSAL CAPSULOTOMIES, RIGHT 2ND - 4TH PIP ARTHRODESIS   LIPOMA EXCISION  08/01/2012   Procedure: EXCISION LIPOMA;  Surgeon: Rolm Bookbinder, MD;  Location: [facility];  Service: General;  Laterality: N/A;  EXCISION LIPOMAS X 6 (LEFT TRICEP, RIGHT TRICEP, DELTOID, BIL ABDOMINAL  WALL AND LEFT THIGH)   NASAL SINUS SURGERY     SHOULDER SURGERY     right    No Known Allergies  Allergies as of 01/24/2021   No Known Allergies      Medication List        Accurate as of January 24, 2021  2:25 PM. If you have any questions, ask your  nurse or doctor.          STOP taking these medications    cetirizine 10 MG tablet Commonly known as: ZYRTEC Stopped by: Yvonna Alanis, NP   OLANZapine 5 MG tablet Commonly known as: ZYPREXA Stopped by: Yvonna Alanis, NP       TAKE these medications    acetaminophen 500 MG tablet Commonly known as: TYLENOL Take 1,000 mg by mouth 3 (three) times daily.   buprenorphine 10 MCG/HR Ptwk Commonly known as: BUTRANS Place 1 patch onto the skin once a week.   divalproex 125 MG DR tablet Commonly known as: DEPAKOTE Take 125 mg by mouth 2 (two) times daily.   donepezil 10 MG tablet Commonly known as: ARICEPT Take 20 mg by mouth every morning.   DULoxetine 60 MG capsule Commonly known as: CYMBALTA Take 60 mg by mouth 2 (two) times daily.   Lidocaine 4 % Ptch Apply 1 patch topically daily as needed.   LORazepam 0.5 MG tablet Commonly known as: ATIVAN Take 0.5 mg by mouth daily.   losartan-hydrochlorothiazide 100-12.5 MG tablet Commonly known as: HYZAAR Take 1 tablet by mouth daily.   meloxicam 7.5 MG tablet Commonly known as: MOBIC Take 7.5 mg by mouth daily.   memantine 10 MG tablet Commonly known as: NAMENDA Take 10 mg by mouth 2 (two) times daily.   omeprazole 20 MG capsule Commonly known as: PRILOSEC Take 20 mg by mouth daily.   pravastatin 40 MG tablet Commonly known as: PRAVACHOL Take 40 mg by mouth daily.   pregabalin 100 MG capsule Commonly known as: LYRICA Take 100 mg by mouth 2 (two) times daily.   tamsulosin 0.4 MG Caps capsule Commonly known as: FLOMAX Take 0.4 mg by mouth daily.   zinc oxide 20 % ointment Apply 1 application topically as needed for irritation.        Review of Systems  Unable to perform ROS: Dementia  Endocrine: Negative for polydipsia.   Immunization History  Administered Date(s) Administered   Influenza Split 06/09/2015, 04/04/2016, 02/27/2017   Influenza,inj,Quad PF,6-35 Mos 04/03/2019, 04/06/2020   PFIZER  Comirnaty(Gray Top)Covid-19 Tri-Sucrose Vaccine 12/15/2020   PFIZER(Purple Top)SARS-COV-2 Vaccination 08/25/2019, 09/16/2019, 05/03/2020   Tdap 08/01/2010   Zoster, Live 08/24/2016   Pertinent  Health Maintenance Due  Topic Date Due   COLONOSCOPY (Pts 45-28yrs Insurance coverage will need to be confirmed)  Never done   PNA vac Low Risk Adult (1 of 2 - PCV13) Never done   INFLUENZA VACCINE  02/07/2021   Fall Risk  03/12/2015 03/01/2015  Falls in the past year? No No  Risk for fall due to : Other (Comment) -   Functional Status Survey:    Vitals:   01/24/21 1417  BP: 137/77  Pulse: 60  Resp: 16  Temp: (!) 96.9 F (36.1 C)  SpO2: 92%  Weight: 212 lb 4.8 oz (96.3 kg)  Height: 6\' 4"  (1.93 m)   Body mass index is 25.84 kg/m. Physical Exam Vitals reviewed.  Constitutional:      General: He is not in acute distress. HENT:  Head: Normocephalic.     Right Ear: There is no impacted cerumen.     Left Ear: There is no impacted cerumen.     Nose: Nose normal.     Mouth/Throat:     Mouth: Mucous membranes are moist.  Eyes:     General:        Right eye: No discharge.        Left eye: No discharge.     Extraocular Movements: Extraocular movements intact.     Pupils: Pupils are equal, round, and reactive to light.     Comments: glasses  Cardiovascular:     Rate and Rhythm: Normal rate and regular rhythm.     Pulses: Normal pulses.     Heart sounds: Normal heart sounds. No murmur heard. Pulmonary:     Effort: Pulmonary effort is normal. No respiratory distress.     Breath sounds: Normal breath sounds. No wheezing.  Abdominal:     General: Bowel sounds are normal. There is no distension.     Palpations: Abdomen is soft.     Tenderness: There is no abdominal tenderness.  Musculoskeletal:     Right lower leg: No edema.     Left lower leg: No edema.  Skin:    General: Skin is warm and dry.     Capillary Refill: Capillary refill takes less than 2 seconds.  Neurological:      General: No focal deficit present.     Mental Status: He is alert. Mental status is at baseline.     Motor: Weakness present.     Gait: Gait abnormal.     Comments: Wheelchair,walker  Psychiatric:        Mood and Affect: Mood normal.        Behavior: Behavior normal.        Cognition and Memory: Memory is impaired.     Comments: Very pleasant, follows commands    Labs reviewed: Recent Labs    09/13/20 0000  NA 143  K 3.8  CL 108  CO2 29*  BUN 21  CREATININE 1.2  CALCIUM 9.4   Recent Labs    09/13/20 0000 12/09/20 0000 12/14/20 0000  AST 24 15 16   ALT 41* 23 20  ALKPHOS 65 63 62  ALBUMIN 3.9 4.5 4.6   Recent Labs    09/13/20 0000 10/28/20 0000 12/09/20 0000 12/14/20 0000  WBC 6.5 5.6 6.0 7.5  NEUTROABS 4,264.00  --   --   --   HGB 15.5 15.7 15.8 15.5  HCT 46 48 46 47  PLT 246 316 298 319   Lab Results  Component Value Date   TSH 1.46 09/13/2020   No results found for: HGBA1C Lab Results  Component Value Date   CHOL 177 09/13/2020   HDL 45 09/13/2020   LDLCALC 109 09/13/2020    Significant Diagnostic Results in last 30 days:  No results found.  Assessment/Plan 1. 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 Chronic low back pain, unspecified back pain laterality, unspecified whether sciatica present - stable with butrans patch weekly - cont lidocaine patch 4% daily prn - cont scheduled tylenol 500 mg tid - cont mobic/prilosec regimen daily  3. Idiopathic peripheral neuropathy - stable with  cymbalta and lyrica   4. Benign prostatic hyperplasia, unspecified whether lower urinary tract symptoms present - no issues with voiding - cont flomax 0.4 mg daily  5. Essential hypertension -  controlled - cont losartan- HCTZ 100-12.5 mg daily  6. Hyperlipidemia, unspecified hyperlipidemia type - LDL 109 09/13/2020  - cont pravastatin 40 mg daily  7. Depression, recurrent ([place]) - cont cymbalta and Lyrica  8. Weight loss - lost 8 lbs on past month - cont monthly weights   Family/ staff Communication: plan discussed with patient and nurse  Labs/tests ordered:  none

## 2021-01-27 LAB — BASIC METABOLIC PANEL
BUN: 25 — AB (ref 4–21)
CO2: 32 — AB (ref 13–22)
Chloride: 106 (ref 99–108)
Creatinine: 1.1 (ref 0.6–1.3)
Glucose: 84
Potassium: 3.7 (ref 3.4–5.3)
Sodium: 145 (ref 137–147)

## 2021-01-27 LAB — HEPATIC FUNCTION PANEL
ALT: 13 (ref 10–40)
AST: 12 — AB (ref 14–40)
Alkaline Phosphatase: 55 (ref 25–125)
Bilirubin, Total: 1.1

## 2021-01-27 LAB — COMPREHENSIVE METABOLIC PANEL
Albumin: 3.9 (ref 3.5–5.0)
Calcium: 9.5 (ref 8.7–10.7)
Globulin: 1.7

## 2021-01-27 LAB — CBC AND DIFFERENTIAL
HCT: 43 (ref 41–53)
Hemoglobin: 14.4 (ref 13.5–17.5)
Neutrophils Absolute: 3579
Platelets: 267 (ref 150–399)
WBC: 5.8

## 2021-01-27 LAB — CBC: RBC: 4.79 (ref 3.87–5.11)

## 2021-02-07 ENCOUNTER — Encounter: Payer: Self-pay | Admitting: Orthopedic Surgery

## 2021-02-07 ENCOUNTER — Non-Acute Institutional Stay (SKILLED_NURSING_FACILITY): Payer: Medicare Other | Admitting: Orthopedic Surgery

## 2021-02-07 DIAGNOSIS — F339 Major depressive disorder, recurrent, unspecified: Secondary | ICD-10-CM

## 2021-02-07 DIAGNOSIS — M545 Low back pain, unspecified: Secondary | ICD-10-CM

## 2021-02-07 DIAGNOSIS — F02818 Dementia in other diseases classified elsewhere, unspecified severity, with other behavioral disturbance: Secondary | ICD-10-CM

## 2021-02-07 DIAGNOSIS — G8929 Other chronic pain: Secondary | ICD-10-CM

## 2021-02-07 DIAGNOSIS — F0281 Dementia in other diseases classified elsewhere with behavioral disturbance: Secondary | ICD-10-CM

## 2021-02-07 DIAGNOSIS — R451 Restlessness and agitation: Secondary | ICD-10-CM

## 2021-02-07 DIAGNOSIS — N400 Enlarged prostate without lower urinary tract symptoms: Secondary | ICD-10-CM

## 2021-02-07 DIAGNOSIS — I10 Essential (primary) hypertension: Secondary | ICD-10-CM

## 2021-02-07 DIAGNOSIS — G300 Alzheimer's disease with early onset: Secondary | ICD-10-CM | POA: Diagnosis not present

## 2021-02-07 DIAGNOSIS — G609 Hereditary and idiopathic neuropathy, unspecified: Secondary | ICD-10-CM

## 2021-02-07 DIAGNOSIS — E785 Hyperlipidemia, unspecified: Secondary | ICD-10-CM

## 2021-02-07 NOTE — Progress Notes (Signed)
Location:   [place] Room Number: [place] of Service:  SNF [id number]) Provider:  Windell Moulding, NP  Virgie Dad, MD  Patient Care Team: Virgie Dad, MD as PCP - General (Internal Medicine)  Extended Emergency Contact Information Primary Emergency Contact: Troup,Donna Address: [address]          [place], [place] [address] Johnnette Litter of [place] Phone: [phone] Mobile Phone: [phone] Relation: Spouse  Code Status:  DNR Goals of care: Advanced Directive information Advanced Directives 02/07/2021  Does Patient Have a Medical Advance Directive? Yes  Type of Paramedic of [place];Living will;Out of facility DNR (pink MOST or yellow form)  Does patient want to make changes to medical advance directive? No - Patient declined  Copy of [place] in Chart? Yes - validated most recent copy scanned in chart (See row information)  Would patient like information on creating a medical advance directive? -  Pre-existing out of facility DNR order (yellow form or pink MOST form) Yellow form placed in chart (order not valid for inpatient use);Pink MOST form placed in chart (order not valid for inpatient use)     Chief Complaint  Patient presents with   Acute Visit    Patient presents for increased agitation and being combative with staff.    HPI:  Pt is a 65 y.o. male seen today for an acute visit for increased agitation.   He continues to reside on the skilled nursing unit at [facility] due to Alzheimer's dementia. Past medical history includes: HTN, peripheral neuropathy, BPH, chronic back pain, depression HLD, seasonal allergies, and hyperbilirubinemia.   07/29 nursing reports fall with no injury. He was observed on the floor in front of his room. 07/30 he was noted to be resistant to eating breakfast or taking his medications. 07/31 he was observed cursing at staff and swatting at them. Wife was  notified and she was able to calm him down.   07/15 [facility] by [facility] for increased behaviors. He was taken off Zyprexa and advised to continue Depakote, Aricept, ativan and namenda regimen.   HTN, BUN/creat 21/1.2 09/13/2020, losartan-HCTZ 100-12.5 mg daily Chronic back pain, butrans patch/mobic/ tylenol/ Cymbalta/ lyrica regimen daily, denies back pain today Peripheral neuropathy, Cymbalta and lyrica regimen BPH, no retention, Flomax 0.4 mg daily HLD, LDL 109 09/13/2020, pravastatin 40 mg daily Hyperbilirubinemia, total bilirubin 0.9 12/14/2020  He continues to have frequent falls due to poor safety awareness. He will often for get to use wheelchair or walker.   Today he is very sleepy in his recliner. He is easy to arouse. Follows commands and is very pleasant.    Past Medical History:  Diagnosis Date   Hyperlipidemia    Hypertension    Seasonal allergies    Past Surgical History:  Procedure Laterality Date   HAMMERTOE RECONSTRUCTION WITH WEIL OSTEOTOMY  08/01/2012   Procedure: HAMMERTOE RECONSTRUCTION WITH WEIL OSTEOTOMY;  Surgeon: Wylene Simmer, MD;  Location: [place];  Service: Orthopedics;  Laterality: Right;  RIGHT SCARF OSTEOTOMY, MODIFIED MCBRIDE BUNIONECTOMY, , RIGHT 2ND - 4TH WEIL, RIGHT 2ND - 5TH DORSAL CAPSULOTOMIES, RIGHT 2ND - 4TH PIP ARTHRODESIS   LIPOMA EXCISION  08/01/2012   Procedure: EXCISION LIPOMA;  Surgeon: Rolm Bookbinder, MD;  Location: [facility];  Service: General;  Laterality: N/A;  EXCISION LIPOMAS X 6 (LEFT TRICEP, RIGHT TRICEP, DELTOID, BIL ABDOMINAL WALL AND LEFT THIGH)   NASAL SINUS  SURGERY     SHOULDER SURGERY     right    No Known Allergies  Allergies as of 02/07/2021   No Known Allergies      Medication List        Accurate as of February 07, 2021  9:44 AM. If you have any questions, ask your nurse or doctor.          acetaminophen 500 MG tablet Commonly known as: TYLENOL Take 1,000 mg by mouth  3 (three) times daily.   buprenorphine 10 MCG/HR Ptwk Commonly known as: BUTRANS Place 1 patch onto the skin once a week.   divalproex 125 MG DR tablet Commonly known as: DEPAKOTE Take 125 mg by mouth 2 (two) times daily.   donepezil 10 MG tablet Commonly known as: ARICEPT Take 20 mg by mouth every morning.   DULoxetine 60 MG capsule Commonly known as: CYMBALTA Take 60 mg by mouth 2 (two) times daily.   Lidocaine 4 % Ptch Apply 1 patch topically daily as needed.   LORazepam 0.5 MG tablet Commonly known as: ATIVAN Take 0.5 mg by mouth daily.   losartan-hydrochlorothiazide 100-12.5 MG tablet Commonly known as: HYZAAR Take 1 tablet by mouth daily.   meloxicam 7.5 MG tablet Commonly known as: MOBIC Take 7.5 mg by mouth daily.   memantine 10 MG tablet Commonly known as: NAMENDA Take 10 mg by mouth 2 (two) times daily.   omeprazole 20 MG capsule Commonly known as: PRILOSEC Take 20 mg by mouth daily.   pravastatin 40 MG tablet Commonly known as: PRAVACHOL Take 40 mg by mouth daily.   pregabalin 100 MG capsule Commonly known as: LYRICA Take 100 mg by mouth 2 (two) times daily.   tamsulosin 0.4 MG Caps capsule Commonly known as: FLOMAX Take 0.4 mg by mouth daily.   zinc oxide 20 % ointment Apply 1 application topically as needed for irritation.        Review of Systems  Unable to perform ROS: Dementia   Immunization History  Administered Date(s) Administered   Influenza Split 06/09/2015, 04/04/2016, 02/27/2017   Influenza,inj,Quad PF,6-35 Mos 04/03/2019, 04/06/2020   PFIZER Comirnaty(Gray Top)Covid-19 Tri-Sucrose Vaccine 12/15/2020   PFIZER(Purple Top)SARS-COV-2 Vaccination 08/25/2019, 09/16/2019, 05/03/2020   Tdap 08/01/2010   Zoster, Live 08/24/2016   Pertinent  Health Maintenance Due  Topic Date Due   COLONOSCOPY (Pts 45-57yr Insurance coverage will need to be confirmed)  Never done   PNA vac Low Risk Adult (1 of 2 - PCV13) Never done   INFLUENZA  VACCINE  02/07/2021   Fall Risk  03/12/2015 03/01/2015  Falls in the past year? No No  Risk for fall due to : Other (Comment) -   Functional Status Survey:    Vitals:   02/07/21 0939  BP: (!) 148/76  Pulse: 84  Resp: (!) 22  Temp: (!) 97.3 F (36.3 C)  SpO2: 96%  Weight: 212 lb 4.8 oz (96.3 kg)  Height: '6\' 4"'$  (1.93 m)   Body mass index is 25.84 kg/m. Physical Exam Vitals reviewed.  Constitutional:      General: He is not in acute distress. HENT:     Head: Normocephalic.  Eyes:     General:        Right eye: No discharge.        Left eye: No discharge.  Cardiovascular:     Rate and Rhythm: Normal rate and regular rhythm.     Pulses: Normal pulses.     Heart sounds: Normal heart  sounds. No murmur heard. Pulmonary:     Effort: Pulmonary effort is normal. No respiratory distress.     Breath sounds: Normal breath sounds. No wheezing.  Abdominal:     General: Bowel sounds are normal. There is no distension.     Palpations: Abdomen is soft.     Tenderness: There is no abdominal tenderness.  Musculoskeletal:     Cervical back: Normal range of motion.     Right lower leg: No edema.     Left lower leg: No edema.  Lymphadenopathy:     Cervical: No cervical adenopathy.  Skin:    General: Skin is warm and dry.     Capillary Refill: Capillary refill takes less than 2 seconds.  Neurological:     General: No focal deficit present.     Mental Status: He is alert. Mental status is at baseline.     Motor: Weakness present.     Gait: Gait abnormal.     Comments: Wheelchair/walker  Psychiatric:        Mood and Affect: Mood normal. Affect is flat.        Behavior: Behavior normal.        Cognition and Memory: Memory is impaired.    Labs reviewed: Recent Labs    09/13/20 0000  NA 143  K 3.8  CL 108  CO2 29*  BUN 21  CREATININE 1.2  CALCIUM 9.4   Recent Labs    09/13/20 0000 12/09/20 0000 12/14/20 0000  AST '24 15 16  '$ ALT 41* 23 20  ALKPHOS 65 63 62  ALBUMIN  3.9 4.5 4.6   Recent Labs    09/13/20 0000 10/28/20 0000 12/09/20 0000 12/14/20 0000  WBC 6.5 5.6 6.0 7.5  NEUTROABS 4,264.00  --   --   --   HGB 15.5 15.7 15.8 15.5  HCT 46 48 46 47  PLT 246 316 298 319   Lab Results  Component Value Date   TSH 1.46 09/13/2020   No results found for: HGBA1C Lab Results  Component Value Date   CHOL 177 09/13/2020   HDL 45 09/13/2020   LDLCALC 109 09/13/2020    Significant Diagnostic Results in last 30 days:  No results found.  Assessment/Plan 1. 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 Chronic low back pain, unspecified back pain laterality, unspecified whether sciatica present - denies pain today - cont butrans patch, lidocaine patch, sched tylenol and mobic/prilosec regimen  3. Idiopathic peripheral neuropathy - cont lyrica and cymbalta regimen  4. Benign prostatic hyperplasia, unspecified whether lower urinary tract symptoms present - no issues with voiding - cont flomax 0.4 mg daily  5. Essential hypertension - controlled  - cont losartan- HCTZ   6. Hyperlipidemia, unspecified hyperlipidemia type - cont statin  7. Depression, recurrent ([place]) - flat affect - cont Cymbalta and lyrica  8. Agitation - increased agitation and swinging at staff 07/31 - was non-compliant with taking medications 07/30 - suspect alterations in medication administration on 07/30 as possible cause for behaviors 07/31 - vitals stable, no issues with voiding, lung sounds clear - recommend redirecting patient in calming voice - will add ativan 0.5 mg po daily prn  x 14 days   Family/ staff Communication: plan  discussed with patient and nurse  Labs/tests ordered: none

## 2021-02-14 ENCOUNTER — Encounter: Payer: Self-pay | Admitting: Nurse Practitioner

## 2021-02-14 ENCOUNTER — Non-Acute Institutional Stay (SKILLED_NURSING_FACILITY): Payer: Medicare Other | Admitting: Nurse Practitioner

## 2021-02-14 DIAGNOSIS — E785 Hyperlipidemia, unspecified: Secondary | ICD-10-CM

## 2021-02-14 DIAGNOSIS — N400 Enlarged prostate without lower urinary tract symptoms: Secondary | ICD-10-CM

## 2021-02-14 DIAGNOSIS — J302 Other seasonal allergic rhinitis: Secondary | ICD-10-CM | POA: Diagnosis not present

## 2021-02-14 DIAGNOSIS — G300 Alzheimer's disease with early onset: Secondary | ICD-10-CM

## 2021-02-14 DIAGNOSIS — F339 Major depressive disorder, recurrent, unspecified: Secondary | ICD-10-CM

## 2021-02-14 DIAGNOSIS — G609 Hereditary and idiopathic neuropathy, unspecified: Secondary | ICD-10-CM

## 2021-02-14 DIAGNOSIS — I10 Essential (primary) hypertension: Secondary | ICD-10-CM

## 2021-02-14 DIAGNOSIS — F0281 Dementia in other diseases classified elsewhere with behavioral disturbance: Secondary | ICD-10-CM

## 2021-02-14 NOTE — Assessment & Plan Note (Signed)
,   takes Cymbalta, Lorazepam prn, Depakote, TSH 1.46 09/13/20

## 2021-02-14 NOTE — Assessment & Plan Note (Signed)
Hyperlipidemia, takes Pravastatin, LDL 109 09/13/20

## 2021-02-14 NOTE — Assessment & Plan Note (Signed)
Alzheimer's dementia, takes Memantine, Donepezil, sundowning behavioral issues, agitation. MRI showed severe atrophy and small vessel ischemia, f/u [facility].

## 2021-02-14 NOTE — Assessment & Plan Note (Signed)
Pain is controlled. Chronic lower back pain without sciatica, takes Pregabalin, Meloxicam,  Buprenorphine, Tylenol. Peripheral neuropathy, takes Pregabalin

## 2021-02-14 NOTE — Progress Notes (Signed)
Location:   [facility] Room Number: [phone] Place of Service:  SNF (31) Provider:  Carlitos Bottino Otho Darner, NP  Virgie Dad, MD  Patient Care Team: Virgie Dad, MD as PCP - General (Internal Medicine)  Extended Emergency Contact Information Primary Emergency Contact: Clayton,Donna Address: [address]          [place], [place] [address] Johnnette Litter of [place] Phone: [phone] Mobile Phone: [phone] Relation: Spouse  Code Status:  DNR Goals of care: Advanced Directive information Advanced Directives 02/14/2021  Does Patient Have a Medical Advance Directive? Yes  Type of Paramedic of [place];Living will;Out of facility DNR (pink MOST or yellow form)  Does patient want to make changes to medical advance directive? No - Patient declined  Copy of [place] in Chart? Yes - validated most recent copy scanned in chart (See row information)  Would patient like information on creating a medical advance directive? -  Pre-existing out of facility DNR order (yellow form or pink MOST form) Yellow form placed in chart (order not valid for inpatient use);Pink MOST form placed in chart (order not valid for inpatient use)     Chief Complaint  Patient presents with   Medical Management of Chronic Issues    Routine follow up   Health Maintenance    Discuss need for HIV screening, hepatitis c screening, colonoscopy, shinlges vaccine, and td/tdap vaccine.     HPI:  Pt is a 65 y.o. male seen today for medical management of chronic diseases    HTN, takes Losartan/HCTZ, Bun/creat 21/1.2 09/13/20             Chronic lower back pain without sciatica, takes Pregabalin, Meloxicam,  Buprenorphine, Tylenol.             Peripheral neuropathy, takes Pregabalin             Hyperlipidemia, takes Pravastatin, LDL 109 09/13/20             Depression, takes Cymbalta, Lorazepam prn, Depakote, TSH 1.46 09/13/20             BPH takes  Tamsulosin             Alzheimer's dementia, takes Memantine, Donepezil, sundowning behavioral issues, agitation. MRI showed severe atrophy and small vessel ischemia, f/u [facility].             Seasonal allergies, antihistamine.               Past Medical History:  Diagnosis Date   Hyperlipidemia    Hypertension    Seasonal allergies    Past Surgical History:  Procedure Laterality Date   HAMMERTOE RECONSTRUCTION WITH WEIL OSTEOTOMY  08/01/2012   Procedure: HAMMERTOE RECONSTRUCTION WITH WEIL OSTEOTOMY;  Surgeon: Wylene Simmer, MD;  Location: [facility];  Service: Orthopedics;  Laterality: Right;  RIGHT SCARF OSTEOTOMY, MODIFIED MCBRIDE BUNIONECTOMY, , RIGHT 2ND - 4TH WEIL, RIGHT 2ND - 5TH DORSAL CAPSULOTOMIES, RIGHT 2ND - 4TH PIP ARTHRODESIS   LIPOMA EXCISION  08/01/2012   Procedure: EXCISION LIPOMA;  Surgeon: Rolm Bookbinder, MD;  Location: [facility];  Service: General;  Laterality: N/A;  EXCISION LIPOMAS X 6 (LEFT TRICEP, RIGHT TRICEP, DELTOID, BIL ABDOMINAL WALL AND LEFT THIGH)   NASAL SINUS SURGERY     SHOULDER SURGERY     right    No Known Allergies  Allergies as of 02/14/2021   No Known Allergies  Medication List        Accurate as of February 14, 2021 11:59 PM. If you have any questions, ask your nurse or doctor.          acetaminophen 500 MG tablet Commonly known as: TYLENOL Take 1,000 mg by mouth 3 (three) times daily.   buprenorphine 10 MCG/HR Ptwk Commonly known as: BUTRANS Place 1 patch onto the skin once a week.   divalproex 125 MG DR tablet Commonly known as: DEPAKOTE Take 125 mg by mouth 2 (two) times daily.   donepezil 10 MG tablet Commonly known as: ARICEPT Take 20 mg by mouth every morning.   DULoxetine 60 MG capsule Commonly known as: CYMBALTA Take 60 mg by mouth 2 (two) times daily.   Lidocaine 4 % Ptch Apply 1 patch topically daily as needed.   LORazepam 0.5 MG tablet Commonly known as: ATIVAN Take 0.5  mg by mouth daily.   losartan-hydrochlorothiazide 100-12.5 MG tablet Commonly known as: HYZAAR Take 1 tablet by mouth daily.   meloxicam 7.5 MG tablet Commonly known as: MOBIC Take 7.5 mg by mouth daily.   memantine 10 MG tablet Commonly known as: NAMENDA Take 10 mg by mouth 2 (two) times daily.   OLANZapine 5 MG tablet Commonly known as: ZYPREXA Take 5 mg by mouth at bedtime.   omeprazole 20 MG capsule Commonly known as: PRILOSEC Take 20 mg by mouth daily.   pravastatin 40 MG tablet Commonly known as: PRAVACHOL Take 40 mg by mouth daily.   pregabalin 100 MG capsule Commonly known as: LYRICA Take 100 mg by mouth 2 (two) times daily.   tamsulosin 0.4 MG Caps capsule Commonly known as: FLOMAX Take 0.4 mg by mouth daily.   zinc oxide 20 % ointment Apply 1 application topically as needed for irritation.   ZyrTEC Allergy 10 MG tablet Generic drug: cetirizine Take 10 mg by mouth daily.        Review of Systems  Constitutional:  Negative for fatigue, fever and unexpected weight change.  HENT:  Negative for congestion, hearing loss and trouble swallowing.   Eyes:  Negative for visual disturbance.  Respiratory:  Negative for cough and shortness of breath.   Cardiovascular:  Positive for leg swelling.  Gastrointestinal:  Negative for abdominal pain and constipation.  Genitourinary:  Negative for dysuria and urgency.  Musculoskeletal:  Positive for arthralgias, back pain and gait problem.  Skin:  Negative for color change.  Neurological:  Negative for tremors and headaches.       Dementia, ROS was provided with assistance of staff.   Psychiatric/Behavioral:  Positive for agitation, behavioral problems and confusion. Negative for sleep disturbance. The patient is not nervous/anxious.    Immunization History  Administered Date(s) Administered   Influenza Split 06/09/2015, 04/04/2016, 02/27/2017   Influenza,inj,Quad PF,6-35 Mos 04/03/2019, 04/06/2020   PFIZER  Comirnaty(Gray Top)Covid-19 Tri-Sucrose Vaccine 12/15/2020   PFIZER(Purple Top)SARS-COV-2 Vaccination 08/25/2019, 09/16/2019, 05/03/2020   Tdap 08/01/2010   Zoster, Live 08/24/2016   Pertinent  Health Maintenance Due  Topic Date Due   COLONOSCOPY (Pts 45-38yr Insurance coverage will need to be confirmed)  Never done   PNA vac Low Risk Adult (1 of 2 - PCV13) Never done   INFLUENZA VACCINE  02/07/2021   Fall Risk  03/12/2015 03/01/2015  Falls in the past year? No No  Risk for fall due to : Other (Comment) -   Functional Status Survey:    Vitals:   02/14/21 1129  BP: (!) 143/89  Pulse: 79  Resp: (!) 24  Temp: (!) 96.9 F (36.1 C)  SpO2: 94%  Weight: 213 lb 8 oz (96.8 kg)  Height: '6\' 4"'$  (1.93 m)   Body mass index is 25.99 kg/m. Physical Exam Vitals and nursing note reviewed.  Constitutional:      Appearance: Normal appearance.  HENT:     Head: Normocephalic and atraumatic.     Mouth/Throat:     Mouth: Mucous membranes are moist.  Eyes:     Extraocular Movements: Extraocular movements intact.     Conjunctiva/sclera: Conjunctivae normal.     Pupils: Pupils are equal, round, and reactive to light.  Cardiovascular:     Rate and Rhythm: Normal rate and regular rhythm.     Heart sounds: No murmur heard. Pulmonary:     Effort: Pulmonary effort is normal.     Breath sounds: No rales.  Abdominal:     General: Bowel sounds are normal.     Palpations: Abdomen is soft.     Tenderness: There is no abdominal tenderness.  Musculoskeletal:        General: Normal range of motion.     Cervical back: Normal range of motion and neck supple.     Right lower leg: Edema present.     Left lower leg: Edema present.     Comments: Trace edema BLE  Skin:    General: Skin is warm and dry.  Neurological:     General: No focal deficit present.     Mental Status: He is alert. Mental status is at baseline.     Motor: No weakness.     Coordination: Coordination normal.     Gait: Gait  abnormal.     Comments: Oriented to person  Psychiatric:     Comments: Followed simple directions during my examination.     Labs reviewed: Recent Labs    09/13/20 0000  NA 143  K 3.8  CL 108  CO2 29*  BUN 21  CREATININE 1.2  CALCIUM 9.4   Recent Labs    12/09/20 0000 12/14/20 0000 01/06/21 0000  AST '15 16 17  '$ ALT '23 20 21  '$ ALKPHOS 63 62 51  ALBUMIN 4.5 4.6 3.8   Recent Labs    09/13/20 0000 10/28/20 0000 12/09/20 0000 12/14/20 0000  WBC 6.5 5.6 6.0 7.5  NEUTROABS 4,264.00  --   --   --   HGB 15.5 15.7 15.8 15.5  HCT 46 48 46 47  PLT 246 316 298 319   Lab Results  Component Value Date   TSH 1.46 09/13/2020   No results found for: HGBA1C Lab Results  Component Value Date   CHOL 177 09/13/2020   HDL 45 09/13/2020   LDLCALC 109 09/13/2020    Significant Diagnostic Results in last 30 days:  No results found.  Assessment/Plan Depression, recurrent ([place]) , takes Cymbalta, Lorazepam prn, Depakote, TSH 1.46 09/13/20  BPH (benign prostatic hyperplasia) BPH takes Tamsulosin, no urinary retention.   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 MRI showed severe atrophy and small vessel ischemia, f/u [facility].  Seasonal allergies Seasonal allergies, antihistamine.  Hyperlipidemia Hyperlipidemia, takes Pravastatin, LDL 109 09/13/20  Peripheral neuropathy Pain is controlled. Chronic lower back pain without sciatica, takes Pregabalin, Meloxicam,  Buprenorphine, Tylenol. Peripheral neuropathy, takes Pregabalin  HTN (hypertension) Blood pressure is controlled, takes Losartan/HCTZ, Bun/creat 21/1.2 09/13/20    Family/ staff Communication: plan of care reviewed with the patient and charge  nurse.   Labs/tests ordered:  none  Time spend 35 minutes.

## 2021-02-14 NOTE — Assessment & Plan Note (Signed)
Blood pressure is controlled, takes Losartan/HCTZ, Bun/creat 21/1.2 09/13/20

## 2021-02-14 NOTE — Assessment & Plan Note (Signed)
BPH takes Tamsulosin, no urinary retention.

## 2021-02-14 NOTE — Assessment & Plan Note (Signed)
Seasonal allergies, antihistamine.

## 2021-02-15 ENCOUNTER — Encounter: Payer: Self-pay | Admitting: Nurse Practitioner

## 2021-02-22 ENCOUNTER — Other Ambulatory Visit: Payer: Self-pay

## 2021-02-22 MED ORDER — LORAZEPAM 0.5 MG PO TABS: 0.5000 mg | ORAL_TABLET | Freq: Every day | ORAL | 0 refills | Status: DC

## 2021-02-22 NOTE — Telephone Encounter (Signed)
Refill request received from [facility] for lorazepam 0.5 mg tablet. Medication pended and sent to Man X Mast, NP for approval

## 2021-02-25 ENCOUNTER — Encounter: Payer: Self-pay | Admitting: Orthopedic Surgery

## 2021-02-25 ENCOUNTER — Non-Acute Institutional Stay (SKILLED_NURSING_FACILITY): Payer: Medicare Other | Admitting: Orthopedic Surgery

## 2021-02-25 DIAGNOSIS — G300 Alzheimer's disease with early onset: Secondary | ICD-10-CM

## 2021-02-25 DIAGNOSIS — F0281 Dementia in other diseases classified elsewhere with behavioral disturbance: Secondary | ICD-10-CM | POA: Diagnosis not present

## 2021-02-25 DIAGNOSIS — R400 Somnolence: Secondary | ICD-10-CM

## 2021-02-25 DIAGNOSIS — F02818 Dementia in other diseases classified elsewhere, unspecified severity, with other behavioral disturbance: Secondary | ICD-10-CM

## 2021-02-25 NOTE — Progress Notes (Signed)
Location:   [facility] Room Number: [place] of Service:  SNF [phone]) Provider:  Windell Moulding, NP  Virgie Dad, MD  Patient Care Team: Virgie Dad, MD as PCP - General (Internal Medicine)  Extended Emergency Contact Information Primary Emergency Contact: Paternostro,Donna Address: [address]          [place], [place] [address] Johnnette Litter of [place] Phone: [phone] Mobile Phone: [phone] Relation: Spouse  Code Status:  DNR Goals of care: Advanced Directive information Advanced Directives 02/25/2021  Does Patient Have a Medical Advance Directive? Yes  Type of Paramedic of [place];Living will;Out of facility DNR (pink MOST or yellow form)  Does patient want to make changes to medical advance directive? No - Patient declined  Copy of [place] in Chart? Yes - validated most recent copy scanned in chart (See row information)  Would patient like information on creating a medical advance directive? -  Pre-existing out of facility DNR order (yellow form or pink MOST form) Yellow form placed in chart (order not valid for inpatient use);Pink MOST form placed in chart (order not valid for inpatient use)     Chief Complaint  Patient presents with   Acute Visit    Patient presents for increased drowsiness     HPI:  Pt is a 65 y.o. male seen today for an acute visit for increased drowsiness.   He continues to reside on the skilled nursing unit at [facility] due to Alzheimer's dementia. Past medical history includes: HTN, peripheral neuropathy, BPH, chronic back pain, depression HLD, seasonal allergies, and hyperbilirubinemia.  08/15 he was seen by [facility] psych provider for increased behaviors and agitation. His Depakote was increased to 250 mg bid. After being on the increased dose for a few days, nursing reports he is sleeping more throughout the day and not getting OOB. Today, Juan Arroyo was asleep  when I entered the room. He was easily aroused and was able to answer my questions.   Continues to have frequent falls due to poor safety awareness.   Nurse does not have any other concerns, vitals stable.  Past Medical History:  Diagnosis Date   Hyperlipidemia    Hypertension    Seasonal allergies    Past Surgical History:  Procedure Laterality Date   HAMMERTOE RECONSTRUCTION WITH WEIL OSTEOTOMY  08/01/2012   Procedure: HAMMERTOE RECONSTRUCTION WITH WEIL OSTEOTOMY;  Surgeon: Wylene Simmer, MD;  Location: [facility];  Service: Orthopedics;  Laterality: Right;  RIGHT SCARF OSTEOTOMY, MODIFIED MCBRIDE BUNIONECTOMY, , RIGHT 2ND - 4TH WEIL, RIGHT 2ND - 5TH DORSAL CAPSULOTOMIES, RIGHT 2ND - 4TH PIP ARTHRODESIS   LIPOMA EXCISION  08/01/2012   Procedure: EXCISION LIPOMA;  Surgeon: Rolm Bookbinder, MD;  Location: [facility];  Service: General;  Laterality: N/A;  EXCISION LIPOMAS X 6 (LEFT TRICEP, RIGHT TRICEP, DELTOID, BIL ABDOMINAL WALL AND LEFT THIGH)   NASAL SINUS SURGERY     SHOULDER SURGERY     right    No Known Allergies  Allergies as of 02/25/2021   No Known Allergies      Medication List        Accurate as of February 25, 2021  9:59 AM. If you have any questions, ask your nurse or doctor.          STOP taking these medications    OLANZapine 5 MG tablet Commonly known as: ZYPREXA Stopped by: Yvonna Alanis, NP  TAKE these medications    acetaminophen 500 MG tablet Commonly known as: TYLENOL Take 1,000 mg by mouth 3 (three) times daily.   buprenorphine 10 MCG/HR Ptwk Commonly known as: BUTRANS Place 1 patch onto the skin once a week.   cetirizine 10 MG tablet Commonly known as: ZYRTEC Take 10 mg by mouth daily.   divalproex 125 MG DR tablet Commonly known as: DEPAKOTE Take 125 mg by mouth 2 (two) times daily.   donepezil 10 MG tablet Commonly known as: ARICEPT Take 20 mg by mouth every morning.   DULoxetine 60 MG  capsule Commonly known as: CYMBALTA Take 60 mg by mouth 2 (two) times daily.   Lidocaine 4 % Ptch Apply 1 patch topically daily as needed.   LORazepam 0.5 MG tablet Commonly known as: ATIVAN Take 1 tablet (0.5 mg total) by mouth daily.   losartan-hydrochlorothiazide 100-12.5 MG tablet Commonly known as: HYZAAR Take 1 tablet by mouth daily.   meloxicam 7.5 MG tablet Commonly known as: MOBIC Take 7.5 mg by mouth daily.   memantine 10 MG tablet Commonly known as: NAMENDA Take 10 mg by mouth 2 (two) times daily.   omeprazole 20 MG capsule Commonly known as: PRILOSEC Take 20 mg by mouth daily.   pravastatin 40 MG tablet Commonly known as: PRAVACHOL Take 40 mg by mouth daily.   pregabalin 100 MG capsule Commonly known as: LYRICA Take 100 mg by mouth 2 (two) times daily.   tamsulosin 0.4 MG Caps capsule Commonly known as: FLOMAX Take 0.4 mg by mouth daily.   zinc oxide 20 % ointment Apply 1 application topically as needed for irritation.        Review of Systems  Unable to perform ROS: Dementia   Immunization History  Administered Date(s) Administered   Influenza Split 06/09/2015, 04/04/2016, 02/27/2017   Influenza,inj,Quad PF,6-35 Mos 04/03/2019, 04/06/2020   PFIZER Comirnaty(Gray Top)Covid-19 Tri-Sucrose Vaccine 12/15/2020   PFIZER(Purple Top)SARS-COV-2 Vaccination 08/25/2019, 09/16/2019, 05/03/2020   Tdap 08/01/2010   Zoster, Live 08/24/2016   Pertinent  Health Maintenance Due  Topic Date Due   COLONOSCOPY (Pts 45-61yr Insurance coverage will need to be confirmed)  Never done   PNA vac Low Risk Adult (1 of 2 - PCV13) Never done   INFLUENZA VACCINE  02/07/2021   Fall Risk  03/12/2015 03/01/2015  Falls in the past year? No No  Risk for fall due to : Other (Comment) -   Functional Status Survey:    Vitals:   02/25/21 0953  BP: 127/83  Pulse: (!) 108  Resp: 19  Temp: (!) 97.5 F (36.4 C)  SpO2: 96%  Weight: 213 lb 8 oz (96.8 kg)  Height: '6\' 4"'$   (1.93 m)   Body mass index is 25.99 kg/m. Physical Exam Vitals reviewed.  Constitutional:      Appearance: Normal appearance.  HENT:     Head: Normocephalic.  Eyes:     General:        Right eye: No discharge.        Left eye: No discharge.     Extraocular Movements: Extraocular movements intact.     Pupils: Pupils are equal, round, and reactive to light.  Cardiovascular:     Rate and Rhythm: Normal rate and regular rhythm.     Pulses: Normal pulses.     Heart sounds: Normal heart sounds. No murmur heard. Pulmonary:     Effort: Pulmonary effort is normal. No respiratory distress.     Breath sounds: Normal breath sounds. No  wheezing.  Abdominal:     General: Bowel sounds are normal. There is no distension.     Palpations: Abdomen is soft.     Tenderness: There is no abdominal tenderness.  Skin:    General: Skin is warm and dry.     Capillary Refill: Capillary refill takes less than 2 seconds.  Neurological:     General: No focal deficit present.     Mental Status: He is easily aroused. Mental status is at baseline.     Motor: Weakness present.     Gait: Gait abnormal.     Comments: wheelchair  Psychiatric:        Mood and Affect: Mood normal. Affect is flat.        Behavior: Behavior normal.        Cognition and Memory: Memory is impaired.    Labs reviewed: Recent Labs    09/13/20 0000  NA 143  K 3.8  CL 108  CO2 29*  BUN 21  CREATININE 1.2  CALCIUM 9.4   Recent Labs    12/09/20 0000 12/14/20 0000 01/06/21 0000  AST '15 16 17  '$ ALT '23 20 21  '$ ALKPHOS 63 62 51  ALBUMIN 4.5 4.6 3.8   Recent Labs    09/13/20 0000 10/28/20 0000 12/09/20 0000 12/14/20 0000  WBC 6.5 5.6 6.0 7.5  NEUTROABS 4,264.00  --   --   --   HGB 15.5 15.7 15.8 15.5  HCT 46 48 46 47  PLT 246 316 298 319   Lab Results  Component Value Date   TSH 1.46 09/13/2020   No results found for: HGBA1C Lab Results  Component Value Date   CHOL 177 09/13/2020   HDL 45 09/13/2020    LDLCALC 109 09/13/2020    Significant Diagnostic Results in last 30 days:  No results found.  Assessment/Plan 1. Somnolence - suspect increased Depakote as cause for symptoms - will reduce Depakote to 125 mg tid  2. 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

## 2021-02-28 ENCOUNTER — Encounter: Payer: Self-pay | Admitting: Nurse Practitioner

## 2021-02-28 ENCOUNTER — Non-Acute Institutional Stay (SKILLED_NURSING_FACILITY): Payer: Medicare Other | Admitting: Nurse Practitioner

## 2021-02-28 DIAGNOSIS — I10 Essential (primary) hypertension: Secondary | ICD-10-CM

## 2021-02-28 DIAGNOSIS — G300 Alzheimer's disease with early onset: Secondary | ICD-10-CM

## 2021-02-28 DIAGNOSIS — F02818 Dementia in other diseases classified elsewhere, unspecified severity, with other behavioral disturbance: Secondary | ICD-10-CM

## 2021-02-28 DIAGNOSIS — M545 Low back pain, unspecified: Secondary | ICD-10-CM

## 2021-02-28 DIAGNOSIS — F0281 Dementia in other diseases classified elsewhere with behavioral disturbance: Secondary | ICD-10-CM

## 2021-02-28 DIAGNOSIS — G609 Hereditary and idiopathic neuropathy, unspecified: Secondary | ICD-10-CM

## 2021-02-28 DIAGNOSIS — R296 Repeated falls: Secondary | ICD-10-CM | POA: Diagnosis not present

## 2021-02-28 DIAGNOSIS — G8929 Other chronic pain: Secondary | ICD-10-CM

## 2021-02-28 DIAGNOSIS — F339 Major depressive disorder, recurrent, unspecified: Secondary | ICD-10-CM

## 2021-02-28 DIAGNOSIS — E785 Hyperlipidemia, unspecified: Secondary | ICD-10-CM

## 2021-02-28 DIAGNOSIS — N400 Enlarged prostate without lower urinary tract symptoms: Secondary | ICD-10-CM

## 2021-02-28 NOTE — Assessment & Plan Note (Signed)
Alzheimer's dementia, takes Memantine, Donepezil, sundowning behavioral issues, agitation. MRI showed severe atrophy and small vessel ischemia, f/u [facility].

## 2021-02-28 NOTE — Assessment & Plan Note (Signed)
takes Pravastatin, LDL 109 09/13/20

## 2021-02-28 NOTE — Assessment & Plan Note (Signed)
His mood is stable,  takes Cymbalta, Lorazepam prn, Depakote(decreased 02/25/21 due to sleepiness) TSH 1.46 09/13/20

## 2021-02-28 NOTE — Progress Notes (Signed)
Location:   [facility] Room Number: [phone] Place of Service:  SNF (31) Provider:  Jovontae Banko Otho Darner, NP  Virgie Dad, MD  Patient Care Team: Virgie Dad, MD as PCP - General (Internal Medicine)  Extended Emergency Contact Information Primary Emergency Contact: Keatts,Donna Address: [address]          [place], [place] [address] Johnnette Litter of [place] Phone: [phone] Mobile Phone: [phone] Relation: Spouse  Code Status:  DNR Goals of care: Advanced Directive information Advanced Directives 02/28/2021  Does Patient Have a Medical Advance Directive? Yes  Type of Paramedic of [place];Living will;Out of facility DNR (pink MOST or yellow form)  Does patient want to make changes to medical advance directive? No - Patient declined  Copy of [place] in Chart? Yes - validated most recent copy scanned in chart (See row information)  Would patient like information on creating a medical advance directive? -  Pre-existing out of facility DNR order (yellow form or pink MOST form) Yellow form placed in chart (order not valid for inpatient use);Pink MOST form placed in chart (order not valid for inpatient use)     Chief Complaint  Patient presents with   Acute Visit    Patient presents after a fall with high blood pressure    HPI:  Pt is a 65 y.o. male seen today for an acute visit for elevated Bp in am 168/95, 155/93, 169/95. The patient denied headache, dizziness, change of vision, chest pian/pressure, palpitation, or SOB.   Fall 02/26/21 when the patient was observed on the floor, no apparent injury   HTN, takes Losartan/HCTZ, Bun/creat 21/1.2 02/13/21             Chronic lower back pain without sciatica, takes Pregabalin, Meloxicam,  Buprenorphine, Tylenol.             Peripheral neuropathy, takes Pregabalin             Hyperlipidemia, takes Pravastatin, LDL 109 09/13/20             Depression, takes  Cymbalta, Lorazepam prn, Depakote(decreased 02/25/21 due to sleepiness) TSH 1.46 09/13/20             BPH takes Tamsulosin             Alzheimer's dementia, takes Memantine, Donepezil, sundowning behavioral issues, agitation. MRI showed severe atrophy and small vessel ischemia, f/u [facility].             Seasonal allergies, antihistamine.                 Past Medical History:  Diagnosis Date   Hyperlipidemia    Hypertension    Seasonal allergies    Past Surgical History:  Procedure Laterality Date   HAMMERTOE RECONSTRUCTION WITH WEIL OSTEOTOMY  08/01/2012   Procedure: HAMMERTOE RECONSTRUCTION WITH WEIL OSTEOTOMY;  Surgeon: Wylene Simmer, MD;  Location: [facility];  Service: Orthopedics;  Laterality: Right;  RIGHT SCARF OSTEOTOMY, MODIFIED MCBRIDE BUNIONECTOMY, , RIGHT 2ND - 4TH WEIL, RIGHT 2ND - 5TH DORSAL CAPSULOTOMIES, RIGHT 2ND - 4TH PIP ARTHRODESIS   LIPOMA EXCISION  08/01/2012   Procedure: EXCISION LIPOMA;  Surgeon: Rolm Bookbinder, MD;  Location: [facility];  Service: General;  Laterality: N/A;  EXCISION LIPOMAS X 6 (LEFT TRICEP, RIGHT TRICEP, DELTOID, BIL ABDOMINAL WALL AND LEFT THIGH)   NASAL SINUS SURGERY     SHOULDER SURGERY     right  No Known Allergies  Allergies as of 02/28/2021   No Known Allergies      Medication List        Accurate as of February 28, 2021 11:59 PM. If you have any questions, ask your nurse or doctor.          acetaminophen 500 MG tablet Commonly known as: TYLENOL Take 1,000 mg by mouth 3 (three) times daily.   buprenorphine 10 MCG/HR Ptwk Commonly known as: BUTRANS Place 1 patch onto the skin once a week.   cetirizine 10 MG tablet Commonly known as: ZYRTEC Take 10 mg by mouth daily.   divalproex 125 MG DR tablet Commonly known as: DEPAKOTE Take 125 mg by mouth in the morning, at noon, and at bedtime.   donepezil 10 MG tablet Commonly known as: ARICEPT Take 20 mg by mouth every morning.    DULoxetine 60 MG capsule Commonly known as: CYMBALTA Take 60 mg by mouth 2 (two) times daily.   Lidocaine 4 % Ptch Apply 1 patch topically daily as needed.   LORazepam 0.5 MG tablet Commonly known as: ATIVAN Take 1 tablet (0.5 mg total) by mouth daily.   losartan-hydrochlorothiazide 100-12.5 MG tablet Commonly known as: HYZAAR Take 1 tablet by mouth daily.   meloxicam 7.5 MG tablet Commonly known as: MOBIC Take 7.5 mg by mouth daily.   memantine 10 MG tablet Commonly known as: NAMENDA Take 10 mg by mouth 2 (two) times daily.   omeprazole 20 MG capsule Commonly known as: PRILOSEC Take 20 mg by mouth daily.   pravastatin 40 MG tablet Commonly known as: PRAVACHOL Take 40 mg by mouth daily.   pregabalin 100 MG capsule Commonly known as: LYRICA Take 100 mg by mouth 2 (two) times daily.   tamsulosin 0.4 MG Caps capsule Commonly known as: FLOMAX Take 0.4 mg by mouth daily.   zinc oxide 20 % ointment Apply 1 application topically as needed for irritation.        Review of Systems  Constitutional:  Negative for activity change, appetite change and fever.  HENT:  Negative for congestion, hearing loss and trouble swallowing.   Eyes:  Negative for visual disturbance.  Respiratory:  Negative for cough and shortness of breath.   Cardiovascular:  Positive for leg swelling.  Gastrointestinal:  Negative for abdominal pain and constipation.  Genitourinary:  Negative for dysuria and urgency.  Musculoskeletal:  Positive for arthralgias, back pain and gait problem.  Skin:  Negative for color change.  Neurological:  Negative for tremors and headaches.       Dementia, ROS was provided with assistance of staff.   Psychiatric/Behavioral:  Positive for agitation, behavioral problems and confusion. Negative for sleep disturbance. The patient is not nervous/anxious.    Immunization History  Administered Date(s) Administered   Influenza Split 06/09/2015, 04/04/2016, 02/27/2017    Influenza,inj,Quad PF,6-35 Mos 04/03/2019, 04/06/2020   PFIZER Comirnaty(Gray Top)Covid-19 Tri-Sucrose Vaccine 12/15/2020   PFIZER(Purple Top)SARS-COV-2 Vaccination 08/25/2019, 09/16/2019, 05/03/2020   Tdap 08/01/2010   Zoster, Live 08/24/2016   Pertinent  Health Maintenance Due  Topic Date Due   COLONOSCOPY (Pts 45-31yr Insurance coverage will need to be confirmed)  Never done   PNA vac Low Risk Adult (1 of 2 - PCV13) Never done   INFLUENZA VACCINE  02/07/2021   Fall Risk  03/12/2015 03/01/2015  Falls in the past year? No No  Risk for fall due to : Other (Comment) -   Functional Status Survey:    Vitals:   02/28/21  1020  BP: (!) 168/95  Pulse: 61  Resp: 20  Temp: 97.9 F (36.6 C)  SpO2: 97%  Weight: 213 lb 8 oz (96.8 kg)  Height: '6\' 4"'$  (1.93 m)   Body mass index is 25.99 kg/m. Physical Exam Vitals and nursing note reviewed.  Constitutional:      Appearance: Normal appearance.  HENT:     Head: Normocephalic and atraumatic.     Mouth/Throat:     Mouth: Mucous membranes are moist.  Eyes:     Extraocular Movements: Extraocular movements intact.     Conjunctiva/sclera: Conjunctivae normal.     Pupils: Pupils are equal, round, and reactive to light.  Cardiovascular:     Rate and Rhythm: Normal rate and regular rhythm.     Heart sounds: No murmur heard. Pulmonary:     Effort: Pulmonary effort is normal.     Breath sounds: No rales.  Abdominal:     General: Bowel sounds are normal.     Palpations: Abdomen is soft.     Tenderness: There is no abdominal tenderness.  Musculoskeletal:        General: Normal range of motion.     Cervical back: Normal range of motion and neck supple.     Right lower leg: Edema present.     Left lower leg: Edema present.     Comments: Trace edema BLE  Skin:    General: Skin is warm and dry.  Neurological:     General: No focal deficit present.     Mental Status: He is alert. Mental status is at baseline.     Motor: No weakness.      Coordination: Coordination normal.     Gait: Gait abnormal.     Comments: Oriented to person  Psychiatric:     Comments: Followed simple directions during my examination.     Labs reviewed: Recent Labs    09/13/20 0000  NA 143  K 3.8  CL 108  CO2 29*  BUN 21  CREATININE 1.2  CALCIUM 9.4   Recent Labs    12/09/20 0000 12/14/20 0000 01/06/21 0000  AST '15 16 17  '$ ALT '23 20 21  '$ ALKPHOS 63 62 51  ALBUMIN 4.5 4.6 3.8   Recent Labs    09/13/20 0000 10/28/20 0000 12/09/20 0000 12/14/20 0000  WBC 6.5 5.6 6.0 7.5  NEUTROABS 4,264.00  --   --   --   HGB 15.5 15.7 15.8 15.5  HCT 46 48 46 47  PLT 246 316 298 319   Lab Results  Component Value Date   TSH 1.46 09/13/2020   No results found for: HGBA1C Lab Results  Component Value Date   CHOL 177 09/13/2020   HDL 45 09/13/2020   LDLCALC 109 09/13/2020    Significant Diagnostic Results in last 30 days:  No results found.  Assessment/Plan HTN (hypertension) elevated Bp in am 168/95, 155/93, 169/95. The patient denied headache, dizziness, change of vision, chest pian/pressure, palpitation, or SOB.  Dc Cozaar, will try Losartan '100mg'$  qhs, HCTZ '25mg'$  in am, loose Bp control in setting of frequent falls, Parkinson's dx.   Recurrent falls Multiple factorials, poor safety awareness, unsteadiness in feet, close supervision/assistance for safety.   Chronic lower back pain Chronic lower back pain without sciatica, takes Pregabalin, Meloxicam,  Buprenorphine, Tylenol.  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 MRI  showed severe atrophy and small vessel ischemia, f/u [facility].    Family/ staff Communication: plan of care reviewed with the patient and charge nurse.   Labs/tests ordered:  none  Time spend 35 minutes.

## 2021-02-28 NOTE — Assessment & Plan Note (Signed)
Multiple factorials, poor safety awareness, unsteadiness in feet, close supervision/assistance for safety.

## 2021-02-28 NOTE — Assessment & Plan Note (Signed)
takes Tamsulosin 

## 2021-02-28 NOTE — Assessment & Plan Note (Signed)
Stable,  takes Pregabalin

## 2021-02-28 NOTE — Assessment & Plan Note (Signed)
elevated Bp in am 168/95, 155/93, 169/95. The patient denied headache, dizziness, change of vision, chest pian/pressure, palpitation, or SOB.  Dc Cozaar, will try Losartan '100mg'$  qhs, HCTZ '25mg'$  in am, loose Bp control in setting of frequent falls, Parkinson's dx.

## 2021-02-28 NOTE — Assessment & Plan Note (Signed)
Chronic lower back pain without sciatica, takes Pregabalin, Meloxicam,  Buprenorphine, Tylenol.

## 2021-03-01 ENCOUNTER — Encounter: Payer: Self-pay | Admitting: Nurse Practitioner

## 2021-03-09 ENCOUNTER — Other Ambulatory Visit: Payer: Self-pay | Admitting: *Deleted

## 2021-03-09 MED ORDER — PREGABALIN 100 MG PO CAPS: 100.0000 mg | ORAL_CAPSULE | Freq: Two times a day (BID) | ORAL | 0 refills | Status: DC

## 2021-03-09 NOTE — Telephone Encounter (Signed)
Pharmacy requested refill.  Pended Rx and sent to [facility] for approval.  

## 2021-03-24 ENCOUNTER — Encounter: Payer: Self-pay | Admitting: Internal Medicine

## 2021-03-24 ENCOUNTER — Non-Acute Institutional Stay (SKILLED_NURSING_FACILITY): Payer: Medicare Other | Admitting: Internal Medicine

## 2021-03-24 DIAGNOSIS — F339 Major depressive disorder, recurrent, unspecified: Secondary | ICD-10-CM

## 2021-03-24 DIAGNOSIS — F0281 Dementia in other diseases classified elsewhere with behavioral disturbance: Secondary | ICD-10-CM

## 2021-03-24 DIAGNOSIS — I10 Essential (primary) hypertension: Secondary | ICD-10-CM

## 2021-03-24 DIAGNOSIS — N400 Enlarged prostate without lower urinary tract symptoms: Secondary | ICD-10-CM

## 2021-03-24 DIAGNOSIS — R296 Repeated falls: Secondary | ICD-10-CM | POA: Diagnosis not present

## 2021-03-24 DIAGNOSIS — E785 Hyperlipidemia, unspecified: Secondary | ICD-10-CM

## 2021-03-24 DIAGNOSIS — M545 Low back pain, unspecified: Secondary | ICD-10-CM | POA: Diagnosis not present

## 2021-03-24 DIAGNOSIS — G8929 Other chronic pain: Secondary | ICD-10-CM

## 2021-03-24 DIAGNOSIS — R634 Abnormal weight loss: Secondary | ICD-10-CM

## 2021-03-24 DIAGNOSIS — F02818 Dementia in other diseases classified elsewhere, unspecified severity, with other behavioral disturbance: Secondary | ICD-10-CM

## 2021-03-24 DIAGNOSIS — G300 Alzheimer's disease with early onset: Secondary | ICD-10-CM

## 2021-03-24 DIAGNOSIS — G609 Hereditary and idiopathic neuropathy, unspecified: Secondary | ICD-10-CM | POA: Diagnosis not present

## 2021-03-24 LAB — BASIC METABOLIC PANEL
BUN: 24 — AB (ref 4–21)
CO2: 34 — AB (ref 13–22)
Chloride: 106 (ref 99–108)
Creatinine: 1 (ref 0.6–1.3)
Glucose: 80
Potassium: 3.8 (ref 3.4–5.3)
Sodium: 144 (ref 137–147)

## 2021-03-24 LAB — AMMONIA
Ammonia: 44
Valproic Acid: 23.3

## 2021-03-24 LAB — COMPREHENSIVE METABOLIC PANEL: Calcium: 9.2 (ref 8.7–10.7)

## 2021-03-24 NOTE — Progress Notes (Signed)
Location:   [facility] Room Number: 8 Place of Service:  SNF [phone]) Provider:  Veleta Miners MD  Virgie Dad, MD  Patient Care Team: Virgie Dad, MD as PCP - General (Internal Medicine)  Extended Emergency Contact Information Primary Emergency Contact: Heckart,Donna Address: [address]          [place], [place] [address] Johnnette Litter of [place] Phone: [phone] Mobile Phone: [phone] Relation: Spouse  Code Status:  DNR Palliative Care Goals of care: Advanced Directive information Advanced Directives 03/24/2021  Does Patient Have a Medical Advance Directive? Yes  Type of Paramedic of [place];Living will;Out of facility DNR (pink MOST or yellow form)  Does patient want to make changes to medical advance directive? No - Patient declined  Copy of [place] in Chart? Yes - validated most recent copy scanned in chart (See row information)  Would patient like information on creating a medical advance directive? -  Pre-existing out of facility DNR order (yellow form or pink MOST form) Yellow form placed in chart (order not valid for inpatient use);Pink MOST form placed in chart (order not valid for inpatient use)     Chief Complaint  Patient presents with   Medical Management of Chronic Issues   Quality Metric Gaps    HIV screen, Hep C screen, Colonoscopy, Shingrix, PCV13, TDAP, Flu shot    HPI:  Pt is a 65 y.o. male seen today for an Routine Visit  Patient has a history of Alzheimer's dementia with behavioral disturbance.  Has had extensive work-up in [facility]. Was diagnosed at age of 42.  Had MRI done which showed severe atrophy and small vessel ischemia.  Has been on Aricept and Namenda. His wife who is the main caregiver has moved him here due to his underlying physical decline and cognitive decline Has history of chronic back pain MRI has shown lumbar stenosis lat L3-4, L4-5. Prior L2  compression fracture On high doses of Lyrica and started on Butrans per Neurology  His active issues continue to be behaviors Off Zyprexa now Better controlled on Depakote TID Continues to fall. Due to poor awareness Now also loosing interest in food. Has lost 15 lbs since I last saw him in 06/22 Wife thinking of considering Hospice  Patient does not have any acute complains No Pain . Alert and responsive but does not follow commands. Does have aphasia    Past Medical History:  Diagnosis Date   Hyperlipidemia    Hypertension    Seasonal allergies    Past Surgical History:  Procedure Laterality Date   HAMMERTOE RECONSTRUCTION WITH WEIL OSTEOTOMY  08/01/2012   Procedure: HAMMERTOE RECONSTRUCTION WITH WEIL OSTEOTOMY;  Surgeon: Wylene Simmer, MD;  Location: [facility];  Service: Orthopedics;  Laterality: Right;  RIGHT SCARF OSTEOTOMY, MODIFIED MCBRIDE BUNIONECTOMY, , RIGHT 2ND - 4TH WEIL, RIGHT 2ND - 5TH DORSAL CAPSULOTOMIES, RIGHT 2ND - 4TH PIP ARTHRODESIS   LIPOMA EXCISION  08/01/2012   Procedure: EXCISION LIPOMA;  Surgeon: Rolm Bookbinder, MD;  Location: [facility];  Service: General;  Laterality: N/A;  EXCISION LIPOMAS X 6 (LEFT TRICEP, RIGHT TRICEP, DELTOID, BIL ABDOMINAL WALL AND LEFT THIGH)   NASAL SINUS SURGERY     SHOULDER SURGERY     right    No Known Allergies  Allergies as of 03/24/2021   No Known Allergies      Medication List        Accurate as  of March 24, 2021 11:37 AM. If you have any questions, ask your nurse or doctor.          STOP taking these medications    losartan-hydrochlorothiazide 100-12.5 MG tablet Commonly known as: HYZAAR Stopped by: Virgie Dad, MD       TAKE these medications    acetaminophen 500 MG tablet Commonly known as: TYLENOL Take 1,000 mg by mouth 3 (three) times daily.   buprenorphine 10 MCG/HR Ptwk Commonly known as: BUTRANS Place 1 patch onto the skin once a week.   cetirizine  10 MG tablet Commonly known as: ZYRTEC Take 10 mg by mouth daily.   divalproex 125 MG DR tablet Commonly known as: DEPAKOTE Take 125 mg by mouth in the morning, at noon, and at bedtime.   donepezil 10 MG tablet Commonly known as: ARICEPT Take 20 mg by mouth every morning.   DULoxetine 60 MG capsule Commonly known as: CYMBALTA Take 60 mg by mouth 2 (two) times daily.   hydrochlorothiazide 25 MG tablet Commonly known as: HYDRODIURIL Take 25 mg by mouth daily.   Lidocaine 4 % Ptch Apply 1 patch topically daily as needed.   LORazepam 0.5 MG tablet Commonly known as: ATIVAN Take 1 tablet (0.5 mg total) by mouth daily.   losartan 100 MG tablet Commonly known as: COZAAR Take 100 mg by mouth daily.   meloxicam 7.5 MG tablet Commonly known as: MOBIC Take 7.5 mg by mouth daily.   memantine 10 MG tablet Commonly known as: NAMENDA Take 10 mg by mouth 2 (two) times daily.   omeprazole 20 MG capsule Commonly known as: PRILOSEC Take 20 mg by mouth daily.   pravastatin 40 MG tablet Commonly known as: PRAVACHOL Take 40 mg by mouth daily.   pregabalin 100 MG capsule Commonly known as: LYRICA Take 1 capsule (100 mg total) by mouth 2 (two) times daily.   tamsulosin 0.4 MG Caps capsule Commonly known as: FLOMAX Take 0.4 mg by mouth daily.   zinc oxide 20 % ointment Apply 1 application topically as needed for irritation.        Review of Systems  Unable to perform ROS: Dementia   Immunization History  Administered Date(s) Administered   Influenza Split 06/09/2015, 04/04/2016, 02/27/2017   Influenza,inj,Quad PF,6-35 Mos 04/03/2019, 04/06/2020   PFIZER Comirnaty(Gray Top)Covid-19 Tri-Sucrose Vaccine 12/15/2020   PFIZER(Purple Top)SARS-COV-2 Vaccination 08/25/2019, 09/16/2019, 05/03/2020   Tdap 08/01/2010   Zoster, Live 08/24/2016   Pertinent  Health Maintenance Due  Topic Date Due   COLONOSCOPY (Pts 45-11yr Insurance coverage will need to be confirmed)  Never done    PNA vac Low Risk Adult (1 of 2 - PCV13) Never done   INFLUENZA VACCINE  02/07/2021   Fall Risk  03/12/2015 03/01/2015  Falls in the past year? No No  Risk for fall due to : Other (Comment) -   Functional Status Survey:    Vitals:   03/24/21 1115  BP: 127/79  Pulse: 86  Resp: 20  Temp: (!) 97.2 F (36.2 C)  SpO2: 95%  Weight: 203 lb 12.8 oz (92.4 kg)  Height: '6\' 4"'$  (1.93 m)   Body mass index is 24.81 kg/m. Physical Exam Vitals reviewed.  Constitutional:      Appearance: Normal appearance.  HENT:     Head: Normocephalic.     Mouth/Throat:     Mouth: Mucous membranes are moist.     Pharynx: Oropharynx is clear.  Eyes:     Pupils: Pupils are equal, round,  and reactive to light.  Cardiovascular:     Rate and Rhythm: Normal rate and regular rhythm.     Pulses: Normal pulses.  Pulmonary:     Effort: Pulmonary effort is normal.     Breath sounds: Normal breath sounds.  Abdominal:     General: Abdomen is flat. Bowel sounds are normal.     Palpations: Abdomen is soft.  Musculoskeletal:        General: No swelling.     Cervical back: Neck supple.  Skin:    General: Skin is warm.  Neurological:     General: No focal deficit present.     Mental Status: He is alert.  Psychiatric:        Mood and Affect: Mood normal.        Thought Content: Thought content normal.    Labs reviewed: Recent Labs    09/13/20 0000 01/27/21 0000  NA 143 145  K 3.8 3.7  CL 108 106  CO2 29* 32*  BUN 21 25*  CREATININE 1.2 1.1  CALCIUM 9.4 9.5   Recent Labs    01/06/21 0000 01/24/21 0000 01/27/21 0000  AST 17 15 12*  ALT '21 16 13  '$ ALKPHOS 51 57 55  ALBUMIN 3.8 3.7 3.9   Recent Labs    09/13/20 0000 10/28/20 0000 12/14/20 0000 01/24/21 0000 01/27/21 0000  WBC 6.5   < > 7.5 6.3 5.8  NEUTROABS 4,264.00  --   --   --  3,579.00  HGB 15.5   < > 15.5 14.9 14.4  HCT 46   < > 47 44 43  PLT 246   < > 319 279 267   < > = values in this interval not displayed.   Lab Results   Component Value Date   TSH 1.46 09/13/2020   No results found for: HGBA1C Lab Results  Component Value Date   CHOL 177 09/13/2020   HDL 45 09/13/2020   LDLCALC 109 09/13/2020    Significant Diagnostic Results in last 30 days:  No results found.  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

## 2021-03-30 ENCOUNTER — Other Ambulatory Visit: Payer: Self-pay

## 2021-03-30 ENCOUNTER — Non-Acute Institutional Stay: Payer: Medicare Other | Admitting: *Deleted

## 2021-03-30 VITALS — BP 128/82 | HR 66 | Temp 97.7°F | Resp 16

## 2021-03-30 DIAGNOSIS — Z515 Encounter for palliative care: Secondary | ICD-10-CM

## 2021-03-31 NOTE — Progress Notes (Addendum)
[facility] COMMUNITY PALLIATIVE CARE RN NOTE  PATIENT NAME: Juan Arroyo DOB: [birth year] MRN: [id number]  PRIMARY CARE PROVIDER: Virgie Dad, MD  RESPONSIBLE PARTY: Bufford Lope (wife) Acct ID - Guarantor Home Phone Work Phone Relationship Acct Type  [id number] GAY, [phone] Self P/F     [place], [place], [place] [phone]   Covid-19 Pre-screening Negative  PLAN OF CARE and INTERVENTION:  ADVANCE CARE PLANNING/GOALS OF CARE: Goal is for patient to remain at current facility [facility]). Comfort measures only.  PATIENT/CAREGIVER EDUCATION: Hospice education, symptom management, safe transfers, fall prevention DISEASE STATUS: Follow-up visit completed with patient in his room at the facility. RN visit requested by his wife during a facility care plan meeting to assess patient's eligibility to transition to hospice care. Upon arrival, patient is sitting up in his wheelchair awake and alert. He is confused, but able to answer some questions appropriately. He has become progressively weaker. He used to be able to ambulate in the hallways, even though his gait was unsteady. He now requires 1-2 person assistance with standing, transfers and ambulation. At times he becomes barely strong enough to stand and take some steps, but usually results in a fall. He had 2 recent falls, one on 03/25/21 and 03/28/21 without injury. He requires assistance with all ADLs, which now includes feeding. Patient can feed himself at times with continual prompting and cueing, however staff usually will have to take over and feed him. He has lost 17 lbs over the past 3 months which is a 7.3% loss. He is currently taking Ativan in the am and Depakote 3x/day due to restlessness. Medications appear to be effective. He is transported via wheelchair. He is incontinent of both bowel and bladder. I spoke to [facility]'s medical director, Dr. Konrad Dolores regarding hospice eligibility.  He agrees that patient is eligible with a diagnosis of Advanced dementia with abnormal weight loss. I called and spoke with his wife Butch Penny to advise of this information and provide hospice education. She also reports a significant steady decline in patient. She says she has to feed him when she comes and is no longer able to transfer him by herself like she used to be able to. She feels that patient has forgotten how to feed himself. She is agreeable in transitioning patient to hospice care. I called and spoke with his PCP, Dr. Lyndel Safe who gave verbal order for a hospice consult. I wrote the order for the facility staff who will fax this to our hospice referral center. I also sent a communication to our referral center with above noted information.  HISTORY OF PRESENT ILLNESS:  This is a 65 yo male with a diagnosis of Alzheimer's dementia. Palliative care received orders to transition patient to hospice care. Referral center will contact wife to arrange date/time for hospice admission visit.  CODE STATUS: DNR ADVANCED DIRECTIVES: Y MOST FORM: yes PPS: 30%   PHYSICAL EXAM:   VITALS: Today's Vitals   03/30/21 1250  BP: 128/82  Pulse: 66  Resp: 16  Temp: 97.7 F (36.5 C)  TempSrc: Temporal  SpO2: 95%  PainSc: 0-No pain    LUNGS: clear to auscultation  CARDIAC: Cor RRR EXTREMITIES: No edema (compression stockings on) SKIN:  Exposed skin is dry and intact   NEURO:  Alert and oriented to self, confused, increased generalized weakness, wheelchair bound, high fall risk   (Duration of visit and documentation 45 minutes)   Daryl Eastern, RN BSN

## 2021-04-15 ENCOUNTER — Encounter: Payer: Self-pay | Admitting: Orthopedic Surgery

## 2021-04-15 ENCOUNTER — Non-Acute Institutional Stay (SKILLED_NURSING_FACILITY): Payer: Medicare Other | Admitting: Orthopedic Surgery

## 2021-04-15 DIAGNOSIS — E785 Hyperlipidemia, unspecified: Secondary | ICD-10-CM

## 2021-04-15 DIAGNOSIS — G300 Alzheimer's disease with early onset: Secondary | ICD-10-CM

## 2021-04-15 DIAGNOSIS — F02818 Dementia in other diseases classified elsewhere, unspecified severity, with other behavioral disturbance: Secondary | ICD-10-CM

## 2021-04-15 DIAGNOSIS — N400 Enlarged prostate without lower urinary tract symptoms: Secondary | ICD-10-CM

## 2021-04-15 DIAGNOSIS — I10 Essential (primary) hypertension: Secondary | ICD-10-CM | POA: Diagnosis not present

## 2021-04-15 DIAGNOSIS — F339 Major depressive disorder, recurrent, unspecified: Secondary | ICD-10-CM

## 2021-04-15 DIAGNOSIS — G8929 Other chronic pain: Secondary | ICD-10-CM

## 2021-04-15 DIAGNOSIS — R634 Abnormal weight loss: Secondary | ICD-10-CM | POA: Diagnosis not present

## 2021-04-15 DIAGNOSIS — R296 Repeated falls: Secondary | ICD-10-CM

## 2021-04-15 DIAGNOSIS — M545 Low back pain, unspecified: Secondary | ICD-10-CM | POA: Diagnosis not present

## 2021-04-15 DIAGNOSIS — G609 Hereditary and idiopathic neuropathy, unspecified: Secondary | ICD-10-CM

## 2021-04-15 DIAGNOSIS — K219 Gastro-esophageal reflux disease without esophagitis: Secondary | ICD-10-CM

## 2021-04-15 NOTE — Progress Notes (Signed)
Location:   [facility] Room Number: 8-A Place of Service:  SNF [phone]) Provider:  Windell Moulding, NP    Patient Care Team: Virgie Dad, MD as PCP - General (Internal Medicine)  Extended Emergency Contact Information Primary Emergency Contact: Kelner,Donna Address: [address]          [place], [place] [address] Johnnette Litter of [place] Phone: [phone] Mobile Phone: [phone] Relation: Spouse  Code Status:  DNR Goals of care: Advanced Directive information Advanced Directives 04/15/2021  Does Patient Have a Medical Advance Directive? Yes  Type of Paramedic of [place];Living will;Out of facility DNR (pink MOST or yellow form)  Does patient want to make changes to medical advance directive? No - Patient declined  Copy of [place] in Chart? Yes - validated most recent copy scanned in chart (See row information)  Would patient like information on creating a medical advance directive? -  Pre-existing out of facility DNR order (yellow form or pink MOST form) -     Chief Complaint  Patient presents with   Medical Management of Chronic Issues    Routine Visit.    Health Maintenance    Discuss the need for HIV Screening, Hepatitis C Screening, and Colonoscopy.   Immunizations    Discuss the need for Shingrix vaccine, Influenza vaccine, and Tetanus vaccine.    HPI:  Pt is a 65 y.o. male seen today for medical management of chronic diseases.    He continues to reside on the skilled nursing unit at [facility] due to Alzheimer's dementia. Past medical history includes: HTN, peripheral neuropathy, BPH, chronic back pain, depression HLD, seasonal allergies, and hyperbilirubinemia.  Alzheimer's- diagnosed at age 57, past MRI revealed severe atrophy and small vessel ischemia, continues to have behavioral issues and poor safety awareness, recent BIMS score 1, followed by hospice as of 04/05/2021, starting to  talk yes and give more " yes" and "no" answers, remains on Aricept, Namenda and Depakote, AST/ALT 15/16 01/2021 Chronic back pain- remains on Butrans weekly patches and tylenol  Weight loss- less interest in foods, October weight not recorded Falls- poor safety awareness, ambulating in wheelchair now, no recent injuries reported BPH- remains on Flomax Peripheral neuropathy- remains on Lyrica Depression- flat affect, remains on Lyrica HLD- LDL 109 09/2020, remains on statin HTN- BUN/creat 24/1.0 03/24/2021, remains on HCTZ and losartan GERD- hgb 14.4 01/27/2021, remains on Prilosec daily  Recent blood pressures:  10/04- 103/67  09/27- 106/70  09/25- 129/74  Recent weights:  09/02- 203.8 lbs  08/01- 213.5 lbs  07/04- 212.3 lbs    Past Medical History:  Diagnosis Date   Hyperlipidemia    Hypertension    Seasonal allergies    Past Surgical History:  Procedure Laterality Date   HAMMERTOE RECONSTRUCTION WITH WEIL OSTEOTOMY  08/01/2012   Procedure: HAMMERTOE RECONSTRUCTION WITH WEIL OSTEOTOMY;  Surgeon: Wylene Simmer, MD;  Location: [facility];  Service: Orthopedics;  Laterality: Right;  RIGHT SCARF OSTEOTOMY, MODIFIED MCBRIDE BUNIONECTOMY, , RIGHT 2ND - 4TH WEIL, RIGHT 2ND - 5TH DORSAL CAPSULOTOMIES, RIGHT 2ND - 4TH PIP ARTHRODESIS   LIPOMA EXCISION  08/01/2012   Procedure: EXCISION LIPOMA;  Surgeon: Rolm Bookbinder, MD;  Location: [facility];  Service: General;  Laterality: N/A;  EXCISION LIPOMAS X 6 (LEFT TRICEP, RIGHT TRICEP, DELTOID, BIL ABDOMINAL WALL AND LEFT THIGH)   NASAL SINUS SURGERY     SHOULDER SURGERY     right  No Known Allergies  Allergies as of 04/15/2021   No Known Allergies      Medication List        Accurate as of April 15, 2021  9:25 AM. If you have any questions, ask your nurse or doctor.          acetaminophen 500 MG tablet Commonly known as: TYLENOL Take 1,000 mg by mouth 3 (three) times daily.    buprenorphine 10 MCG/HR Ptwk Commonly known as: BUTRANS Place 1 patch onto the skin once a week.   cetirizine 10 MG tablet Commonly known as: ZYRTEC Take 10 mg by mouth daily.   divalproex 125 MG DR tablet Commonly known as: DEPAKOTE Take 125 mg by mouth in the morning, at noon, and at bedtime.   donepezil 10 MG tablet Commonly known as: ARICEPT Take 20 mg by mouth every morning.   DULoxetine 60 MG capsule Commonly known as: CYMBALTA Take 60 mg by mouth 2 (two) times daily.   hydrochlorothiazide 25 MG tablet Commonly known as: HYDRODIURIL Take 25 mg by mouth daily.   Lidocaine 4 % Ptch Apply 1 patch topically daily as needed.   LORazepam 0.5 MG tablet Commonly known as: ATIVAN Take 1 tablet (0.5 mg total) by mouth daily.   losartan 100 MG tablet Commonly known as: COZAAR Take 100 mg by mouth daily.   meloxicam 7.5 MG tablet Commonly known as: MOBIC Take 7.5 mg by mouth daily.   memantine 10 MG tablet Commonly known as: NAMENDA Take 10 mg by mouth 2 (two) times daily.   omeprazole 20 MG capsule Commonly known as: PRILOSEC Take 20 mg by mouth daily.   pravastatin 40 MG tablet Commonly known as: PRAVACHOL Take 40 mg by mouth daily.   pregabalin 100 MG capsule Commonly known as: LYRICA Take 1 capsule (100 mg total) by mouth 2 (two) times daily.   tamsulosin 0.4 MG Caps capsule Commonly known as: FLOMAX Take 0.4 mg by mouth daily.   zinc oxide 20 % ointment Apply 1 application topically as needed for irritation.        Review of Systems  Unable to perform ROS: Dementia   Immunization History  Administered Date(s) Administered   Influenza Split 06/09/2015, 04/04/2016, 02/27/2017   Influenza,inj,Quad PF,6-35 Mos 04/03/2019, 04/06/2020   PFIZER Comirnaty(Gray Top)Covid-19 Tri-Sucrose Vaccine 12/15/2020   PFIZER(Purple Top)SARS-COV-2 Vaccination 08/25/2019, 09/16/2019, 05/03/2020   Tdap 08/01/2010   Zoster, Live 08/24/2016   Pertinent  Health  Maintenance Due  Topic Date Due   COLONOSCOPY (Pts 45-88yrs Insurance coverage will need to be confirmed)  Never done   INFLUENZA VACCINE  02/07/2021   Fall Risk  03/12/2015 03/01/2015  Falls in the past year? No No  Risk for fall due to : Other (Comment) -   Functional Status Survey:    Vitals:   04/15/21 0913  BP: 103/67  Pulse: 79  Resp: 18  Temp: (!) 97.5 F (36.4 C)  SpO2: 95%  Weight: 203 lb 12.8 oz (92.4 kg)  Height: 6\' 4"  (1.93 m)   Body mass index is 24.81 kg/m. Physical Exam Vitals reviewed.  Constitutional:      General: He is not in acute distress. HENT:     Head: Normocephalic.     Right Ear: There is no impacted cerumen.     Left Ear: There is no impacted cerumen.     Nose: Nose normal.     Mouth/Throat:     Mouth: Mucous membranes are moist.  Eyes:  General:        Right eye: No discharge.        Left eye: No discharge.  Neck:     Vascular: No carotid bruit.  Cardiovascular:     Rate and Rhythm: Normal rate and regular rhythm.     Pulses: Normal pulses.     Heart sounds: Normal heart sounds. No murmur heard. Pulmonary:     Effort: Pulmonary effort is normal. No respiratory distress.     Breath sounds: Normal breath sounds. No wheezing.  Abdominal:     General: Bowel sounds are normal. There is no distension.     Palpations: Abdomen is soft.     Tenderness: There is no abdominal tenderness.  Musculoskeletal:     Cervical back: Normal range of motion.     Right lower leg: No edema.     Left lower leg: No edema.  Lymphadenopathy:     Cervical: No cervical adenopathy.  Skin:    General: Skin is warm and dry.     Capillary Refill: Capillary refill takes less than 2 seconds.     Coloration: Skin is not jaundiced.     Findings: No bruising.  Neurological:     General: No focal deficit present.     Mental Status: He is alert. Mental status is at baseline.     Motor: Weakness present.     Gait: Gait abnormal.     Comments: wheelchair   Psychiatric:        Mood and Affect: Mood normal.        Behavior: Behavior normal.    Labs reviewed: Recent Labs    09/13/20 0000 01/27/21 0000 03/24/21 0000  NA 143 145 144  K 3.8 3.7 3.8  CL 108 106 106  CO2 29* 32* 34*  BUN 21 25* 24*  CREATININE 1.2 1.1 1.0  CALCIUM 9.4 9.5 9.2   Recent Labs    01/06/21 0000 01/24/21 0000 01/27/21 0000  AST 17 15 12*  ALT 21 16 13   ALKPHOS 51 57 55  ALBUMIN 3.8 3.7 3.9   Recent Labs    09/13/20 0000 10/28/20 0000 12/14/20 0000 01/24/21 0000 01/27/21 0000  WBC 6.5   < > 7.5 6.3 5.8  NEUTROABS 4,264.00  --   --   --  3,579.00  HGB 15.5   < > 15.5 14.9 14.4  HCT 46   < > 47 44 43  PLT 246   < > 319 279 267   < > = values in this interval not displayed.   Lab Results  Component Value Date   TSH 1.46 09/13/2020   No results found for: HGBA1C Lab Results  Component Value Date   CHOL 177 09/13/2020   HDL 45 09/13/2020   LDLCALC 109 09/13/2020    Significant Diagnostic Results in last 30 days:  No results found.  Assessment/Plan 1. 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 Chronic low back pain, unspecified back pain laterality, unspecified whether sciatica present - cont Butrans weekly patches and tylenol   3. Weight loss - no October recording- will discuss with nursing - cont regular diet  4. Recurrent falls - ambulates with wheelchair - poor safety awareness due to AD - cont falls safety precautions   5. Benign prostatic hyperplasia, unspecified whether lower urinary tract  symptoms present - cont Flomax  6. Idiopathic peripheral neuropathy - cont Lyrica  7. Depression, recurrent ([place]) - cont Cymbalta  8. Hyperlipidemia, unspecified hyperlipidemia type - cont statin  9. Primary hypertension - controlled - cont  HCTZ and losartan- may consider reducing if SBP< 100  10. Gastroesophageal reflux disease without esophagitis - cont prilosec    Family/ staff Communication:  plan discussed with nurse  Labs/tests ordered:  none

## 2021-04-18 ENCOUNTER — Encounter: Payer: Self-pay | Admitting: Orthopedic Surgery

## 2021-04-25 ENCOUNTER — Other Ambulatory Visit: Payer: Self-pay | Admitting: *Deleted

## 2021-04-25 MED ORDER — LORAZEPAM 0.5 MG PO TABS: 0.5000 mg | ORAL_TABLET | Freq: Every day | ORAL | 0 refills | Status: DC

## 2021-04-25 NOTE — Telephone Encounter (Signed)
[facility] Requested refill.  °Pended Rx and sent to Amy for approval.  °

## 2021-04-27 ENCOUNTER — Encounter: Payer: Self-pay | Admitting: Orthopedic Surgery

## 2021-04-27 ENCOUNTER — Non-Acute Institutional Stay (SKILLED_NURSING_FACILITY): Payer: Medicare Other | Admitting: Orthopedic Surgery

## 2021-04-27 DIAGNOSIS — G609 Hereditary and idiopathic neuropathy, unspecified: Secondary | ICD-10-CM

## 2021-04-27 DIAGNOSIS — R296 Repeated falls: Secondary | ICD-10-CM

## 2021-04-27 DIAGNOSIS — F02818 Dementia in other diseases classified elsewhere, unspecified severity, with other behavioral disturbance: Secondary | ICD-10-CM

## 2021-04-27 DIAGNOSIS — G300 Alzheimer's disease with early onset: Secondary | ICD-10-CM | POA: Diagnosis not present

## 2021-04-27 DIAGNOSIS — M545 Low back pain, unspecified: Secondary | ICD-10-CM

## 2021-04-27 DIAGNOSIS — N400 Enlarged prostate without lower urinary tract symptoms: Secondary | ICD-10-CM

## 2021-04-27 DIAGNOSIS — F339 Major depressive disorder, recurrent, unspecified: Secondary | ICD-10-CM

## 2021-04-27 DIAGNOSIS — R634 Abnormal weight loss: Secondary | ICD-10-CM

## 2021-04-27 DIAGNOSIS — K219 Gastro-esophageal reflux disease without esophagitis: Secondary | ICD-10-CM

## 2021-04-27 DIAGNOSIS — B9689 Other specified bacterial agents as the cause of diseases classified elsewhere: Secondary | ICD-10-CM

## 2021-04-27 DIAGNOSIS — J019 Acute sinusitis, unspecified: Secondary | ICD-10-CM

## 2021-04-27 DIAGNOSIS — E785 Hyperlipidemia, unspecified: Secondary | ICD-10-CM

## 2021-04-27 DIAGNOSIS — I10 Essential (primary) hypertension: Secondary | ICD-10-CM

## 2021-04-27 DIAGNOSIS — G8929 Other chronic pain: Secondary | ICD-10-CM

## 2021-04-27 MED ORDER — AZITHROMYCIN 250 MG PO TABS: ORAL_TABLET | ORAL | 0 refills | Status: AC

## 2021-04-27 NOTE — Progress Notes (Signed)
Location:   [facility] Room Number: [place] of Service:  SNF [phone]) Provider:  Windell Moulding, NP  Virgie Dad, MD  Patient Care Team: Virgie Dad, MD as PCP - General (Internal Medicine)  Extended Emergency Contact Information Primary Emergency Contact: Percle,Donna Address: [address]          [place], [place] [address] Johnnette Litter of [place] Phone: [phone] Mobile Phone: [phone] Relation: Spouse  Code Status:  DNR Hospice Goals of care: Advanced Directive information Advanced Directives 04/27/2021  Does Patient Have a Medical Advance Directive? Yes  Type of Paramedic of [place];Living will;Out of facility DNR (pink MOST or yellow form)  Does patient want to make changes to medical advance directive? No - Patient declined  Copy of [place] in Chart? Yes - validated most recent copy scanned in chart (See row information)  Would patient like information on creating a medical advance directive? -  Pre-existing out of facility DNR order (yellow form or pink MOST form) Yellow form placed in chart (order not valid for inpatient use);Pink MOST form placed in chart (order not valid for inpatient use)     Chief Complaint  Patient presents with   Acute Visit    Acute Visit for Nasal Congestion    HPI:  Pt is a 65 y.o. male seen today for an acute visit for nasal congestion.   He continues to reside on the skilled nursing unit at [facility] due to Alzheimer's dementia. Past medical history includes: HTN, peripheral neuropathy, BPH, chronic back pain, depression HLD, seasonal allergies, and hyperbilirubinemia.  Symptoms of nasal congestion and dry cough began yesterday. Nurse reports nasal congestion is green/yellow in color. He is a poor historian due to AD, but able to answer simple questions. When asked if he can breather through his nose, he states" no." He remains on zyrtec for seasonal  allergies. Afebrile, vitals stable.   Alzheimer's- diagnosed at age 24, past MRI revealed severe atrophy and small vessel ischemia, continues to have behavioral issues and poor safety awareness, recent BIMS score 1, followed by hospice as of 04/05/2021, beginning to talk less, ambulating less in wheelchair, stays in room most of time, remains on Aricept, Namenda and Depakote, AST/ALT 15/16 01/2021 Chronic back pain- remains on Butrans weekly patches and tylenol  Weight loss- less interest in foods, progressive weight loss in past 2 months, Boost bid Falls- poor safety awareness, ambulating in wheelchair now, transferring less to wheelchair. no recent injuries reported BPH- remains on Flomax Peripheral neuropathy- remains on Lyrica Depression- flat affect, remains on Lyrica HLD- LDL 109 09/2020, remains on statin HTN- BUN/creat 24/1.0 03/24/2021, remains on HCTZ and losartan GERD- hgb 14.4 01/27/2021, remains on Prilosec daily  Recent blood pressures:  10/10- 181.5 lbs  09/02- 203.8 lbs  08/01- 213.5 lbs  Recent blood pressures:  10/18- 103/72  10/11- 119/79  10/04- 103/67   Past Medical History:  Diagnosis Date   Hyperlipidemia    Hypertension    Seasonal allergies    Past Surgical History:  Procedure Laterality Date   HAMMERTOE RECONSTRUCTION WITH WEIL OSTEOTOMY  08/01/2012   Procedure: HAMMERTOE RECONSTRUCTION WITH WEIL OSTEOTOMY;  Surgeon: Wylene Simmer, MD;  Location: [facility];  Service: Orthopedics;  Laterality: Right;  RIGHT SCARF OSTEOTOMY, MODIFIED MCBRIDE BUNIONECTOMY, , RIGHT 2ND - 4TH WEIL, RIGHT 2ND - 5TH DORSAL CAPSULOTOMIES, RIGHT 2ND - 4TH PIP ARTHRODESIS   LIPOMA EXCISION  08/01/2012  Procedure: EXCISION LIPOMA;  Surgeon: Rolm Bookbinder, MD;  Location: [facility];  Service: General;  Laterality: N/A;  EXCISION LIPOMAS X 6 (LEFT TRICEP, RIGHT TRICEP, DELTOID, BIL ABDOMINAL WALL AND LEFT THIGH)   NASAL SINUS SURGERY     SHOULDER  SURGERY     right    No Known Allergies  Allergies as of 04/27/2021   No Known Allergies      Medication List        Accurate as of April 27, 2021 10:31 AM. If you have any questions, ask your nurse or doctor.          acetaminophen 500 MG tablet Commonly known as: TYLENOL Take 1,000 mg by mouth 3 (three) times daily.   buprenorphine 10 MCG/HR Ptwk Commonly known as: BUTRANS Place 1 patch onto the skin once a week.   cetirizine 10 MG tablet Commonly known as: ZYRTEC Take 10 mg by mouth daily.   divalproex 125 MG DR tablet Commonly known as: DEPAKOTE Take 125 mg by mouth in the morning, at noon, and at bedtime.   donepezil 10 MG tablet Commonly known as: ARICEPT Take 20 mg by mouth every morning.   DULoxetine 60 MG capsule Commonly known as: CYMBALTA Take 60 mg by mouth 2 (two) times daily.   hydrochlorothiazide 25 MG tablet Commonly known as: HYDRODIURIL Take 25 mg by mouth daily.   Lidocaine 4 % Ptch Apply 1 patch topically daily as needed.   LORazepam 0.5 MG tablet Commonly known as: ATIVAN Take 1 tablet (0.5 mg total) by mouth daily.   losartan 100 MG tablet Commonly known as: COZAAR Take 100 mg by mouth daily.   meloxicam 7.5 MG tablet Commonly known as: MOBIC Take 7.5 mg by mouth daily.   memantine 10 MG tablet Commonly known as: NAMENDA Take 10 mg by mouth 2 (two) times daily.   omeprazole 20 MG capsule Commonly known as: PRILOSEC Take 20 mg by mouth daily.   pravastatin 40 MG tablet Commonly known as: PRAVACHOL Take 40 mg by mouth daily.   pregabalin 100 MG capsule Commonly known as: LYRICA Take 1 capsule (100 mg total) by mouth 2 (two) times daily.   tamsulosin 0.4 MG Caps capsule Commonly known as: FLOMAX Take 0.4 mg by mouth daily.   zinc oxide 20 % ointment Apply 1 application topically as needed for irritation.        Review of Systems  Unable to perform ROS: Dementia   Immunization History  Administered  Date(s) Administered   Influenza Split 06/09/2015, 04/04/2016, 02/27/2017   Influenza,inj,Quad PF,6-35 Mos 04/03/2019, 04/06/2020   PFIZER Comirnaty(Gray Top)Covid-19 Tri-Sucrose Vaccine 12/15/2020   PFIZER(Purple Top)SARS-COV-2 Vaccination 08/25/2019, 09/16/2019, 05/03/2020   Pfizer Covid-19 Vaccine Bivalent Booster 110yrs & up 03/30/2021   Tdap 08/01/2010   Zoster, Live 08/24/2016   Pertinent  Health Maintenance Due  Topic Date Due   COLONOSCOPY (Pts 45-59yrs Insurance coverage will need to be confirmed)  Never done   INFLUENZA VACCINE  02/07/2021   Fall Risk  03/12/2015 03/01/2015  Falls in the past year? No No  Risk for fall due to : Other (Comment) -   Functional Status Survey:    Vitals:   04/27/21 1014  BP: 103/72  Pulse: (!) 104  Resp: 16  Temp: (!) 97.2 F (36.2 C)  SpO2: 95%  Weight: 181 lb 8 oz (82.3 kg)  Height: 6\' 4"  (1.93 m)   Body mass index is 22.09 kg/m. Physical Exam Vitals reviewed.  Constitutional:  General: He is not in acute distress. HENT:     Head: Normocephalic.     Right Ear: There is no impacted cerumen.     Left Ear: There is no impacted cerumen.     Nose: Congestion and rhinorrhea present.     Right Turbinates: Enlarged and swollen.     Left Turbinates: Enlarged and swollen.     Right Sinus: Maxillary sinus tenderness present.     Left Sinus: Maxillary sinus tenderness present.     Mouth/Throat:     Mouth: Mucous membranes are moist.     Pharynx: No oropharyngeal exudate or posterior oropharyngeal erythema.  Eyes:     General:        Right eye: No discharge.        Left eye: No discharge.  Neck:     Vascular: No carotid bruit.  Cardiovascular:     Rate and Rhythm: Normal rate and regular rhythm.     Pulses: Normal pulses.     Heart sounds: Normal heart sounds. No murmur heard. Pulmonary:     Effort: Pulmonary effort is normal. No respiratory distress.     Breath sounds: Normal breath sounds. No wheezing.  Abdominal:      General: Bowel sounds are normal. There is no distension.     Palpations: Abdomen is soft.     Tenderness: There is no abdominal tenderness.  Musculoskeletal:     Cervical back: Normal range of motion.     Right lower leg: No edema.     Left lower leg: No edema.  Lymphadenopathy:     Cervical: Cervical adenopathy present.  Skin:    General: Skin is warm and dry.     Capillary Refill: Capillary refill takes less than 2 seconds.  Neurological:     General: No focal deficit present.     Mental Status: He is alert. Mental status is at baseline.     Motor: Weakness present.     Gait: Gait abnormal.     Comments: wheelchair  Psychiatric:        Mood and Affect: Mood normal. Affect is flat.        Behavior: Behavior normal.        Cognition and Memory: Memory is impaired.     Comments: Gives "yes" and "no" responses, follows commands    Labs reviewed: Recent Labs    09/13/20 0000 01/27/21 0000 03/24/21 0000  NA 143 145 144  K 3.8 3.7 3.8  CL 108 106 106  CO2 29* 32* 34*  BUN 21 25* 24*  CREATININE 1.2 1.1 1.0  CALCIUM 9.4 9.5 9.2   Recent Labs    01/06/21 0000 01/24/21 0000 01/27/21 0000  AST 17 15 12*  ALT 21 16 13   ALKPHOS 51 57 55  ALBUMIN 3.8 3.7 3.9   Recent Labs    09/13/20 0000 10/28/20 0000 12/14/20 0000 01/24/21 0000 01/27/21 0000  WBC 6.5   < > 7.5 6.3 5.8  NEUTROABS 4,264.00  --   --   --  3,579.00  HGB 15.5   < > 15.5 14.9 14.4  HCT 46   < > 47 44 43  PLT 246   < > 319 279 267   < > = values in this interval not displayed.   Lab Results  Component Value Date   TSH 1.46 09/13/2020   No results found for: HGBA1C Lab Results  Component Value Date   CHOL 177 09/13/2020   HDL 45  09/13/2020   [place] 109 09/13/2020    Significant Diagnostic Results in last 30 days:  No results found.  Assessment/Plan 1. Acute bacterial sinusitis - nasal turbinates swollen, maxillary facial tenderness, nasal drainage green/yellow - cont Zyrtec -  azithromycin (ZITHROMAX) 250 MG tablet; Take 2 tablets on day 1, then 1 tablet daily on days 2 through 5  Dispense: 6 tablet; Refill: 0  2. 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 Chronic low back pain, unspecified back pain laterality, unspecified whether sciatica present - cont Butrans weekly patches and Tylenol  4. Weight loss - lost about 20 lbs in past month - cont Boost bid  5. Recurrent falls - no recent falls - poor safety awareness due to AD - cont falls safety precautions  6. Benign prostatic hyperplasia, unspecified whether lower urinary tract symptoms present - cont Flomax  7. Idiopathic peripheral neuropathy - cont Lyrica  8. Depression, recurrent ([place]) - flat affect today - cont Cymbalta  9. Hyperlipidemia, unspecified hyperlipidemia type - cont pravastatin  10. Primary hypertension - controlled - cont losartan  11. Gastroesophageal reflux disease without esophagitis - cont omeprazole    Family/ staff Communication: plan discussed with patient and nurse  Labs/tests ordered:  none

## 2021-05-09 ENCOUNTER — Non-Acute Institutional Stay (SKILLED_NURSING_FACILITY): Payer: Medicare Other | Admitting: Nurse Practitioner

## 2021-05-09 ENCOUNTER — Encounter: Payer: Self-pay | Admitting: Nurse Practitioner

## 2021-05-09 DIAGNOSIS — I10 Essential (primary) hypertension: Secondary | ICD-10-CM

## 2021-05-09 DIAGNOSIS — N400 Enlarged prostate without lower urinary tract symptoms: Secondary | ICD-10-CM

## 2021-05-09 DIAGNOSIS — G609 Hereditary and idiopathic neuropathy, unspecified: Secondary | ICD-10-CM

## 2021-05-09 DIAGNOSIS — G300 Alzheimer's disease with early onset: Secondary | ICD-10-CM

## 2021-05-09 DIAGNOSIS — R296 Repeated falls: Secondary | ICD-10-CM

## 2021-05-09 DIAGNOSIS — E785 Hyperlipidemia, unspecified: Secondary | ICD-10-CM

## 2021-05-09 DIAGNOSIS — K219 Gastro-esophageal reflux disease without esophagitis: Secondary | ICD-10-CM | POA: Insufficient documentation

## 2021-05-09 DIAGNOSIS — M545 Low back pain, unspecified: Secondary | ICD-10-CM | POA: Diagnosis not present

## 2021-05-09 DIAGNOSIS — G8929 Other chronic pain: Secondary | ICD-10-CM

## 2021-05-09 DIAGNOSIS — R634 Abnormal weight loss: Secondary | ICD-10-CM

## 2021-05-09 DIAGNOSIS — J302 Other seasonal allergic rhinitis: Secondary | ICD-10-CM

## 2021-05-09 DIAGNOSIS — F339 Major depressive disorder, recurrent, unspecified: Secondary | ICD-10-CM

## 2021-05-09 DIAGNOSIS — F02818 Dementia in other diseases classified elsewhere, unspecified severity, with other behavioral disturbance: Secondary | ICD-10-CM

## 2021-05-09 NOTE — Assessment & Plan Note (Signed)
Seasonal allergies, takes Zyrtec, last treated sinusitis 04/27/21

## 2021-05-09 NOTE — Assessment & Plan Note (Signed)
His mood is managed on Cymbalta, Lorazepam prn, Depakote, TSH 1.46 09/13/20

## 2021-05-09 NOTE — Assessment & Plan Note (Signed)
No urinary retention,  takes Tamsulosin 

## 2021-05-09 NOTE — Assessment & Plan Note (Signed)
Managed wit Pregabalin.

## 2021-05-09 NOTE — Progress Notes (Signed)
Location:   SNF [place] Room Number: 8 Place of Service:  SNF (31) Provider: [facility] Celena Lanius NP  Virgie Dad, MD  Patient Care Team: Virgie Dad, MD as PCP - General (Internal Medicine)  Extended Emergency Contact Information Primary Emergency Contact: Cade,Donna Address: [address]          [place], [place] [address] Johnnette Litter of [place] Phone: [phone] Mobile Phone: [phone] Relation: Spouse  Code Status:  DNR Goals of care: Advanced Directive information Advanced Directives 05/09/2021  Does Patient Have a Medical Advance Directive? Yes  Type of Paramedic of [place];Living will;Out of facility DNR (pink MOST or yellow form)  Does patient want to make changes to medical advance directive? No - Patient declined  Copy of [place] in Chart? Yes - validated most recent copy scanned in chart (See row information)  Would patient like information on creating a medical advance directive? -  Pre-existing out of facility DNR order (yellow form or pink MOST form) Yellow form placed in chart (order not valid for inpatient use)     Chief Complaint  Patient presents with   Acute Visit    Fall     HPI:  Pt is a 65 y.o. male seen today for an acute visit for reported the fall 05/06/21 when the patient was found sitting on floor next to his bed, no apparent injury.   HTN, takes Losartan, Bun/creat 24/1.0 03/24/21             Chronic lower back pain without sciatica, takes Pregabalin, Meloxicam,  Buprenorphine, Tylenol.             Peripheral neuropathy, takes Pregabalin             Hyperlipidemia, takes Pravastatin, LDL 109 09/13/20             Depression, takes Cymbalta, Lorazepam prn, Depakote, TSH 1.46 09/13/20             BPH takes Tamsulosin             Alzheimer's dementia, takes Memantine, Donepezil, sundowning behavioral issues, agitation. MRI showed severe atrophy and small vessel ischemia, f/u [facility].  Weight loss, supportive care  GERD, takes Omeprazole.              Seasonal allergies, takes Zyrtec, last treated sinusitis 04/27/21  Past Medical History:  Diagnosis Date   Hyperlipidemia    Hypertension    Seasonal allergies    Past Surgical History:  Procedure Laterality Date   HAMMERTOE RECONSTRUCTION WITH WEIL OSTEOTOMY  08/01/2012   Procedure: HAMMERTOE RECONSTRUCTION WITH WEIL OSTEOTOMY;  Surgeon: Wylene Simmer, MD;  Location: [place];  Service: Orthopedics;  Laterality: Right;  RIGHT SCARF OSTEOTOMY, MODIFIED MCBRIDE BUNIONECTOMY, , RIGHT 2ND - 4TH WEIL, RIGHT 2ND - 5TH DORSAL CAPSULOTOMIES, RIGHT 2ND - 4TH PIP ARTHRODESIS   LIPOMA EXCISION  08/01/2012   Procedure: EXCISION LIPOMA;  Surgeon: Rolm Bookbinder, MD;  Location: [place];  Service: General;  Laterality: N/A;  EXCISION LIPOMAS X 6 (LEFT TRICEP, RIGHT TRICEP, DELTOID, BIL ABDOMINAL WALL AND LEFT THIGH)   NASAL SINUS SURGERY     SHOULDER SURGERY     right    No Known Allergies  Allergies as of 05/09/2021   No Known Allergies      Medication List        Accurate as of May 09, 2021 11:59 PM. If you have any  questions, ask your nurse or doctor.          acetaminophen 500 MG tablet Commonly known as: TYLENOL Take 1,000 mg by mouth 3 (three) times daily.   buprenorphine 10 MCG/HR Ptwk Commonly known as: BUTRANS Place 1 patch onto the skin once a week.   cetirizine 10 MG tablet Commonly known as: ZYRTEC Take 10 mg by mouth daily.   divalproex 125 MG DR tablet Commonly known as: DEPAKOTE Take 125 mg by mouth in the morning, at noon, and at bedtime.   donepezil 10 MG tablet Commonly known as: ARICEPT Take 20 mg by mouth every morning.   DULoxetine 60 MG capsule Commonly known as: CYMBALTA Take 60 mg by mouth 2 (two) times daily.   hydrochlorothiazide 25 MG tablet Commonly known as: HYDRODIURIL Take 25 mg by mouth daily.   Lidocaine 4 % Ptch Apply  1 patch topically daily as needed.   LORazepam 0.5 MG tablet Commonly known as: ATIVAN Take 1 tablet (0.5 mg total) by mouth daily.   losartan 100 MG tablet Commonly known as: COZAAR Take 100 mg by mouth daily.   meloxicam 7.5 MG tablet Commonly known as: MOBIC Take 7.5 mg by mouth daily.   memantine 10 MG tablet Commonly known as: NAMENDA Take 10 mg by mouth 2 (two) times daily.   omeprazole 20 MG capsule Commonly known as: PRILOSEC Take 20 mg by mouth daily.   pravastatin 40 MG tablet Commonly known as: PRAVACHOL Take 40 mg by mouth daily.   pregabalin 100 MG capsule Commonly known as: LYRICA Take 1 capsule (100 mg total) by mouth 2 (two) times daily.   tamsulosin 0.4 MG Caps capsule Commonly known as: FLOMAX Take 0.4 mg by mouth daily.   zinc oxide 20 % ointment Apply 1 application topically as needed for irritation.       The patient's wife present during today's visit.  Review of Systems  Constitutional:  Negative for activity change, appetite change and fever.  HENT:  Negative for congestion, hearing loss, rhinorrhea, sinus pressure, sinus pain and trouble swallowing.   Eyes:  Negative for visual disturbance.  Respiratory:  Negative for cough and shortness of breath.   Cardiovascular:  Positive for leg swelling.  Gastrointestinal:  Negative for abdominal pain and constipation.  Genitourinary:  Negative for difficulty urinating, dysuria and urgency.  Musculoskeletal:  Positive for arthralgias, back pain and gait problem.  Skin:  Negative for color change.  Neurological:  Negative for tremors and headaches.       Dementia, ROS was provided with assistance of staff.   Psychiatric/Behavioral:  Positive for agitation, behavioral problems and confusion. Negative for sleep disturbance. The patient is not nervous/anxious.    Immunization History  Administered Date(s) Administered   Influenza Split 06/09/2015, 04/04/2016, 02/27/2017   Influenza,inj,Quad PF,6-35  Mos 04/03/2019, 04/06/2020   PFIZER Comirnaty(Gray Top)Covid-19 Tri-Sucrose Vaccine 12/15/2020   PFIZER(Purple Top)SARS-COV-2 Vaccination 08/25/2019, 09/16/2019, 05/03/2020   Pfizer Covid-19 Vaccine Bivalent Booster 69yrs & up 03/30/2021   Tdap 08/01/2010   Zoster, Live 08/24/2016   Pertinent  Health Maintenance Due  Topic Date Due   COLONOSCOPY (Pts 45-81yrs Insurance coverage will need to be confirmed)  Never done   INFLUENZA VACCINE  02/07/2021   Fall Risk 03/01/2015 03/12/2015  Falls in the past year? No No  Patient at Risk for Falls Due to - Other (Comment)   Functional Status Survey:    Vitals:   05/09/21 1001  BP: 140/70  Pulse: 68  Resp:  20  Temp: 97.8 F (36.6 C)  SpO2: 95%  Weight: 181 lb 8 oz (82.3 kg)  Height: 6\' 4"  (1.93 m)   Body mass index is 22.09 kg/m. Physical Exam Vitals and nursing note reviewed.  Constitutional:      Appearance: Normal appearance.  HENT:     Head: Normocephalic and atraumatic.     Mouth/Throat:     Mouth: Mucous membranes are moist.  Eyes:     Extraocular Movements: Extraocular movements intact.     Conjunctiva/sclera: Conjunctivae normal.     Pupils: Pupils are equal, round, and reactive to light.  Cardiovascular:     Rate and Rhythm: Normal rate and regular rhythm.     Heart sounds: No murmur heard. Pulmonary:     Effort: Pulmonary effort is normal.     Breath sounds: No rales.  Abdominal:     General: Bowel sounds are normal.     Palpations: Abdomen is soft.     Tenderness: There is no abdominal tenderness.  Musculoskeletal:        General: Normal range of motion.     Cervical back: Normal range of motion and neck supple.     Right lower leg: Edema present.     Left lower leg: Edema present.     Comments: Trace edema BLE  Skin:    General: Skin is warm and dry.  Neurological:     General: No focal deficit present.     Mental Status: He is alert. Mental status is at baseline.     Motor: No weakness.      Coordination: Coordination normal.     Gait: Gait abnormal.     Comments: Oriented to person  Psychiatric:     Comments: Followed simple directions during my examination.     Labs reviewed: Recent Labs    09/13/20 0000 01/27/21 0000 03/24/21 0000  NA 143 145 144  K 3.8 3.7 3.8  CL 108 106 106  CO2 29* 32* 34*  BUN 21 25* 24*  CREATININE 1.2 1.1 1.0  CALCIUM 9.4 9.5 9.2   Recent Labs    01/06/21 0000 01/24/21 0000 01/27/21 0000  AST 17 15 12*  ALT 21 16 13   ALKPHOS 51 57 55  ALBUMIN 3.8 3.7 3.9   Recent Labs    09/13/20 0000 10/28/20 0000 12/14/20 0000 01/24/21 0000 01/27/21 0000  WBC 6.5   < > 7.5 6.3 5.8  NEUTROABS 4,264.00  --   --   --  3,579.00  HGB 15.5   < > 15.5 14.9 14.4  HCT 46   < > 47 44 43  PLT 246   < > 319 279 267   < > = values in this interval not displayed.   Lab Results  Component Value Date   TSH 1.46 09/13/2020   No results found for: HGBA1C Lab Results  Component Value Date   CHOL 177 09/13/2020   HDL 45 09/13/2020   LDLCALC 109 09/13/2020    Significant Diagnostic Results in last 30 days:  No results found.  Assessment/Plan Recurrent falls fall 05/06/21 when the patient was found sitting on floor next to his bed, no apparent injury. Lack of safety awareness/increased frailty contributory. Continue close supervision/assistance for safety.   HTN (hypertension) Blood pressure is controlled,  takes Losartan, Bun/creat 24/1.0 03/24/21  Chronic lower back pain Pain is controlled,  takes Pregabalin, Meloxicam,  Buprenorphine, Tylenol.  Peripheral neuropathy Managed wit Pregabalin.   Hyperlipidemia  takes Pravastatin,  LDL 109 09/13/20  Depression, recurrent ([place]) His mood is managed on Cymbalta, Lorazepam prn, Depakote, TSH 1.46 09/13/20  BPH (benign prostatic hyperplasia) No urinary retention, takes Tamsulosin.   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 MRI showed severe atrophy and small vessel ischemia, f/u [facility]. Continue supportive care in SNF [facility], may consider decrease Donepezil if weight loss persists.   Weight loss Continue supportive care, may consider decrease Donepezil if no better.   GERD (gastroesophageal reflux disease) Stable, continue Omeprazole.   Seasonal allergies Seasonal allergies, takes Zyrtec, last treated sinusitis 04/27/21    Family/ staff Communication: plan of care reviewed with the patient and charge nurse.   Labs/tests ordered: none   Time spend 25 minutes.

## 2021-05-09 NOTE — Assessment & Plan Note (Signed)
fall 05/06/21 when the patient was found sitting on floor next to his bed, no apparent injury. Lack of safety awareness/increased frailty contributory. Continue close supervision/assistance for safety.

## 2021-05-09 NOTE — Assessment & Plan Note (Signed)
Pain is controlled,  takes Pregabalin, Meloxicam,  Buprenorphine, Tylenol.

## 2021-05-09 NOTE — Assessment & Plan Note (Signed)
Blood pressure is controlled,  takes Losartan, Bun/creat 24/1.0 03/24/21

## 2021-05-09 NOTE — Assessment & Plan Note (Signed)
Stable, continue Omeprazole.  

## 2021-05-09 NOTE — Assessment & Plan Note (Signed)
takes Pravastatin, LDL 109 09/13/20

## 2021-05-09 NOTE — Assessment & Plan Note (Signed)
Continue supportive care, may consider decrease Donepezil if no better.

## 2021-05-09 NOTE — Assessment & Plan Note (Signed)
akes Memantine, Donepezil, sundowning behavioral issues, agitation. MRI showed severe atrophy and small vessel ischemia, f/u [facility]. Continue supportive care in SNF [facility], may consider decrease Donepezil if weight loss persists.

## 2021-05-10 ENCOUNTER — Encounter: Payer: Self-pay | Admitting: Nurse Practitioner

## 2021-05-11 ENCOUNTER — Encounter: Payer: Self-pay | Admitting: Orthopedic Surgery

## 2021-05-11 ENCOUNTER — Non-Acute Institutional Stay (SKILLED_NURSING_FACILITY): Payer: Medicare Other | Admitting: Orthopedic Surgery

## 2021-05-11 DIAGNOSIS — I10 Essential (primary) hypertension: Secondary | ICD-10-CM

## 2021-05-11 DIAGNOSIS — G300 Alzheimer's disease with early onset: Secondary | ICD-10-CM

## 2021-05-11 DIAGNOSIS — M545 Low back pain, unspecified: Secondary | ICD-10-CM | POA: Diagnosis not present

## 2021-05-11 DIAGNOSIS — G8929 Other chronic pain: Secondary | ICD-10-CM

## 2021-05-11 DIAGNOSIS — E7800 Pure hypercholesterolemia, unspecified: Secondary | ICD-10-CM

## 2021-05-11 DIAGNOSIS — K219 Gastro-esophageal reflux disease without esophagitis: Secondary | ICD-10-CM

## 2021-05-11 DIAGNOSIS — R634 Abnormal weight loss: Secondary | ICD-10-CM

## 2021-05-11 DIAGNOSIS — F02818 Dementia in other diseases classified elsewhere, unspecified severity, with other behavioral disturbance: Secondary | ICD-10-CM

## 2021-05-11 DIAGNOSIS — F339 Major depressive disorder, recurrent, unspecified: Secondary | ICD-10-CM

## 2021-05-11 DIAGNOSIS — G609 Hereditary and idiopathic neuropathy, unspecified: Secondary | ICD-10-CM

## 2021-05-11 DIAGNOSIS — N400 Enlarged prostate without lower urinary tract symptoms: Secondary | ICD-10-CM

## 2021-05-11 NOTE — Progress Notes (Signed)
Location:   [place] Room Number: 8 Place of Service:  SNF [phone]) Provider:  Windell Moulding, NP  Virgie Dad, MD  Patient Care Team: Virgie Dad, MD as PCP - General (Internal Medicine)  Extended Emergency Contact Information Primary Emergency Contact: Mczeal,Donna Address: [address]          [place], [place] [address] Johnnette Litter of [place] Phone: [phone] Mobile Phone: [phone] Relation: Spouse  Code Status:  DNR Goals of care: Advanced Directive information Advanced Directives 05/11/2021  Does Patient Have a Medical Advance Directive? Yes  Type of Paramedic of [place];Living will;Out of facility DNR (pink MOST or yellow form)  Does patient want to make changes to medical advance directive? No - Patient declined  Copy of [place] in Chart? Yes - validated most recent copy scanned in chart (See row information)  Would patient like information on creating a medical advance directive? -  Pre-existing out of facility DNR order (yellow form or pink MOST form) Yellow form placed in chart (order not valid for inpatient use)     Chief Complaint  Patient presents with   Medical Management of Chronic Issues    Routine follow up visit    HPI:  Pt is a 65 y.o. male seen today for medical management of chronic diseases.    He continues to reside on the skilled nursing unit at [facility] due to Alzheimer's dementia. Past medical history includes: HTN, peripheral neuropathy, BPH, chronic back pain, depression HLD, seasonal allergies, and hyperbilirubinemia.  Alzheimer's- enrolled in hospice last month, no recent behavioral outbursts, continues to have poor safety awareness, transfers from bed to chair, eating less, when asked state/ president and his favorite sports team he gave same answer of "[place]" HTN- BUN/creat 24/1.0 03/24/2021, remains on HCTZ and losartan HLD- LDL 109 09/2020,  remains on statin Chronic back pain- remains on Butrans weekly patches, and scheduled tylenol Weight loss- November weight not listed, lost about 18 lbs from September to October 2022 BPH- remains on Flomax Peripheral neuropathy- remains on Lyrica Depression- remains on Lyrica GERD- hgb 14.4 01/2021, remains on Prilosec  Fall without injury 10/28.   Recent blood pressure:  10/31- 117/79, 140/70  10/30- 118/81  10/28- 104/70      Past Medical History:  Diagnosis Date   Hyperlipidemia    Hypertension    Seasonal allergies    Past Surgical History:  Procedure Laterality Date   HAMMERTOE RECONSTRUCTION WITH WEIL OSTEOTOMY  08/01/2012   Procedure: HAMMERTOE RECONSTRUCTION WITH WEIL OSTEOTOMY;  Surgeon: Wylene Simmer, MD;  Location: [place];  Service: Orthopedics;  Laterality: Right;  RIGHT SCARF OSTEOTOMY, MODIFIED MCBRIDE BUNIONECTOMY, , RIGHT 2ND - 4TH WEIL, RIGHT 2ND - 5TH DORSAL CAPSULOTOMIES, RIGHT 2ND - 4TH PIP ARTHRODESIS   LIPOMA EXCISION  08/01/2012   Procedure: EXCISION LIPOMA;  Surgeon: Rolm Bookbinder, MD;  Location: [place];  Service: General;  Laterality: N/A;  EXCISION LIPOMAS X 6 (LEFT TRICEP, RIGHT TRICEP, DELTOID, BIL ABDOMINAL WALL AND LEFT THIGH)   NASAL SINUS SURGERY     SHOULDER SURGERY     right    No Known Allergies  Allergies as of 05/11/2021   No Known Allergies      Medication List        Accurate as of May 11, 2021 10:07 AM. If you have any questions, ask your nurse or doctor.  acetaminophen 500 MG tablet Commonly known as: TYLENOL Take 1,000 mg by mouth 3 (three) times daily.   buprenorphine 10 MCG/HR Ptwk Commonly known as: BUTRANS Place 1 patch onto the skin once a week.   cetirizine 10 MG tablet Commonly known as: ZYRTEC Take 10 mg by mouth daily.   divalproex 125 MG DR tablet Commonly known as: DEPAKOTE Take 125 mg by mouth in the morning, at noon, and at bedtime.   donepezil  10 MG tablet Commonly known as: ARICEPT Take 20 mg by mouth every morning.   DULoxetine 60 MG capsule Commonly known as: CYMBALTA Take 60 mg by mouth 2 (two) times daily.   hydrochlorothiazide 25 MG tablet Commonly known as: HYDRODIURIL Take 25 mg by mouth daily.   Lidocaine 4 % Ptch Apply 1 patch topically daily as needed.   LORazepam 0.5 MG tablet Commonly known as: ATIVAN Take 1 tablet (0.5 mg total) by mouth daily.   losartan 100 MG tablet Commonly known as: COZAAR Take 100 mg by mouth daily.   meloxicam 7.5 MG tablet Commonly known as: MOBIC Take 7.5 mg by mouth daily.   memantine 10 MG tablet Commonly known as: NAMENDA Take 10 mg by mouth 2 (two) times daily.   omeprazole 20 MG capsule Commonly known as: PRILOSEC Take 20 mg by mouth daily.   pravastatin 40 MG tablet Commonly known as: PRAVACHOL Take 40 mg by mouth daily.   pregabalin 100 MG capsule Commonly known as: LYRICA Take 1 capsule (100 mg total) by mouth 2 (two) times daily.   tamsulosin 0.4 MG Caps capsule Commonly known as: FLOMAX Take 0.4 mg by mouth daily.   zinc oxide 20 % ointment Apply 1 application topically as needed for irritation.        Review of Systems  Immunization History  Administered Date(s) Administered   Influenza Split 06/09/2015, 04/04/2016, 02/27/2017   Influenza,inj,Quad PF,6-35 Mos 04/03/2019, 04/06/2020   PFIZER Comirnaty(Gray Top)Covid-19 Tri-Sucrose Vaccine 12/15/2020   PFIZER(Purple Top)SARS-COV-2 Vaccination 08/25/2019, 09/16/2019, 05/03/2020   Pfizer Covid-19 Vaccine Bivalent Booster 71yrs & up 03/30/2021   Tdap 08/01/2010   Zoster, Live 08/24/2016   Pertinent  Health Maintenance Due  Topic Date Due   COLONOSCOPY (Pts 45-39yrs Insurance coverage will need to be confirmed)  Never done   INFLUENZA VACCINE  02/07/2021   Fall Risk 03/01/2015 03/12/2015  Falls in the past year? No No  Patient at Risk for Falls Due to - Other (Comment)   Functional Status  Survey:    Vitals:   05/11/21 0953  BP: 117/79  Pulse: 95  Resp: (!) 22  Temp: (!) 97.3 F (36.3 C)  SpO2: 97%  Weight: 181 lb 8 oz (82.3 kg)  Height: 6\' 4"  (1.93 m)   Body mass index is 22.09 kg/m. Physical Exam Vitals reviewed.  Constitutional:      General: He is not in acute distress. HENT:     Head: Normocephalic.     Right Ear: There is no impacted cerumen.     Left Ear: There is no impacted cerumen.     Nose: Nose normal.     Mouth/Throat:     Mouth: Mucous membranes are moist.  Eyes:     General:        Right eye: No discharge.        Left eye: No discharge.  Neck:     Vascular: No carotid bruit.  Cardiovascular:     Rate and Rhythm: Normal rate and regular rhythm.  Pulses: Normal pulses.     Heart sounds: Normal heart sounds. No murmur heard. Pulmonary:     Effort: Pulmonary effort is normal. No respiratory distress.     Breath sounds: Normal breath sounds. No wheezing.  Abdominal:     General: Abdomen is flat. Bowel sounds are normal. There is no distension.     Palpations: Abdomen is soft.     Tenderness: There is no abdominal tenderness.  Musculoskeletal:     Cervical back: Normal range of motion.     Right lower leg: No edema.     Left lower leg: No edema.  Lymphadenopathy:     Cervical: No cervical adenopathy.  Skin:    General: Skin is warm and dry.     Capillary Refill: Capillary refill takes less than 2 seconds.  Neurological:     General: No focal deficit present.     Mental Status: He is alert. Mental status is at baseline.     Motor: Weakness present.     Gait: Gait abnormal.  Psychiatric:        Mood and Affect: Mood normal. Affect is flat.        Behavior: Behavior normal.        Cognition and Memory: Memory is impaired.     Comments: Very pleasant, follows commands    Labs reviewed: Recent Labs    09/13/20 0000 01/27/21 0000 03/24/21 0000  NA 143 145 144  K 3.8 3.7 3.8  CL 108 106 106  CO2 29* 32* 34*  BUN 21 25* 24*   CREATININE 1.2 1.1 1.0  CALCIUM 9.4 9.5 9.2   Recent Labs    01/06/21 0000 01/24/21 0000 01/27/21 0000  AST 17 15 12*  ALT 21 16 13   ALKPHOS 51 57 55  ALBUMIN 3.8 3.7 3.9   Recent Labs    09/13/20 0000 10/28/20 0000 12/14/20 0000 01/24/21 0000 01/27/21 0000  WBC 6.5   < > 7.5 6.3 5.8  NEUTROABS 4,264.00  --   --   --  3,579.00  HGB 15.5   < > 15.5 14.9 14.4  HCT 46   < > 47 44 43  PLT 246   < > 319 279 267   < > = values in this interval not displayed.   Lab Results  Component Value Date   TSH 1.46 09/13/2020   No results found for: HGBA1C Lab Results  Component Value Date   CHOL 177 09/13/2020   HDL 45 09/13/2020   LDLCALC 109 09/13/2020    Significant Diagnostic Results in last 30 days:  No results found.  Assessment/Plan 1. 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 Primary hypertension - controlled - cont HCTZ and losartan  3. Pure hypercholesterolemia - cont statin  4. Chronic low back pain, unspecified back pain laterality, unspecified whether sciatica present - cont weekly Butrans patches and scheduled tylenol  5. Weight loss - November 2022 weight not posted - cont Boost bid  6. Benign prostatic hyperplasia, unspecified whether lower urinary tract symptoms present - cont Flomax  7. Idiopathic peripheral neuropathy - cont Lyrica  8. Depression, recurrent ([place]) - cont Lyrica  9. Gastroesophageal reflux disease without esophagitis - cont Prilosec    Family/ staff Communication: plan discussed with  nurse  Labs/tests ordered:  none

## 2021-06-09 ENCOUNTER — Non-Acute Institutional Stay (SKILLED_NURSING_FACILITY): Payer: Medicare Other | Admitting: Internal Medicine

## 2021-06-09 ENCOUNTER — Encounter: Payer: Self-pay | Admitting: Internal Medicine

## 2021-06-09 DIAGNOSIS — G300 Alzheimer's disease with early onset: Secondary | ICD-10-CM | POA: Diagnosis not present

## 2021-06-09 DIAGNOSIS — I10 Essential (primary) hypertension: Secondary | ICD-10-CM

## 2021-06-09 DIAGNOSIS — F02818 Dementia in other diseases classified elsewhere, unspecified severity, with other behavioral disturbance: Secondary | ICD-10-CM

## 2021-06-09 DIAGNOSIS — E7800 Pure hypercholesterolemia, unspecified: Secondary | ICD-10-CM | POA: Diagnosis not present

## 2021-06-09 DIAGNOSIS — R296 Repeated falls: Secondary | ICD-10-CM

## 2021-06-09 DIAGNOSIS — G8929 Other chronic pain: Secondary | ICD-10-CM

## 2021-06-09 DIAGNOSIS — M545 Low back pain, unspecified: Secondary | ICD-10-CM

## 2021-06-09 DIAGNOSIS — N400 Enlarged prostate without lower urinary tract symptoms: Secondary | ICD-10-CM

## 2021-06-09 DIAGNOSIS — G609 Hereditary and idiopathic neuropathy, unspecified: Secondary | ICD-10-CM

## 2021-06-09 DIAGNOSIS — R634 Abnormal weight loss: Secondary | ICD-10-CM

## 2021-06-09 DIAGNOSIS — F339 Major depressive disorder, recurrent, unspecified: Secondary | ICD-10-CM

## 2021-06-09 DIAGNOSIS — K219 Gastro-esophageal reflux disease without esophagitis: Secondary | ICD-10-CM

## 2021-06-09 NOTE — Progress Notes (Signed)
Location:  [place] Room Number: 8 Place of Service:  SNF (31)  Provider: Virgie Dad   Code Status: DNR  Goals of Care:  Advanced Directives 06/09/2021  Does Patient Have a Medical Advance Directive? Yes  Type of Paramedic of [place];Living will;Out of facility DNR (pink MOST or yellow form)  Does patient want to make changes to medical advance directive? No - Patient declined  Copy of [place] in Chart? Yes - validated most recent copy scanned in chart (See row information)  Would patient like information on creating a medical advance directive? -  Pre-existing out of facility DNR order (yellow form or pink MOST form) Yellow form placed in chart (order not valid for inpatient use)     Chief Complaint  Patient presents with   Medical Management of Chronic Issues   Quality Metric Gaps    Pneumonia Vaccine 25+ Years old (1 - PCV)   Hepatitis C Screening (Once) COLONOSCOPY  Zoster Vaccines- Shingrix (1 of 2) TETANUS/TDAP    HPI: Patient is a 65 y.o. male seen today for medical management of chronic diseases.     Patient has a history of Alzheimer's dementia with behavioral disturbance.  Has had extensive work-up in [facility]. Was diagnosed at age of 18.  Had MRI done which showed severe atrophy and small vessel ischemia.  Has been on Aricept and Namenda. His wife who is the main caregiver has moved him here due to his underlying physical decline and cognitive decline Has history of chronic back pain MRI has shown lumbar stenosis lat L3-4, L4-5. Prior L2 compression fracture On high doses of Lyrica and started on Butrans per Neurology    He  is stable.Now enrolled   in Hospice  No new Nursing issues. No Behavior issues. On Depakote  Continuous to be fall risk  His weight is stable Has gained weight.   Wt Readings from Last 3 Encounters:  06/09/21 188 lb 14.4 oz (85.7 kg)  05/11/21 181 lb 8 oz  (82.3 kg)  05/09/21 181 lb 8 oz (82.3 kg)    Past Medical History:  Diagnosis Date   Hyperlipidemia    Hypertension    Seasonal allergies     Past Surgical History:  Procedure Laterality Date   HAMMERTOE RECONSTRUCTION WITH WEIL OSTEOTOMY  08/01/2012   Procedure: HAMMERTOE RECONSTRUCTION WITH WEIL OSTEOTOMY;  Surgeon: Wylene Simmer, MD;  Location: [place];  Service: Orthopedics;  Laterality: Right;  RIGHT SCARF OSTEOTOMY, MODIFIED MCBRIDE BUNIONECTOMY, , RIGHT 2ND - 4TH WEIL, RIGHT 2ND - 5TH DORSAL CAPSULOTOMIES, RIGHT 2ND - 4TH PIP ARTHRODESIS   LIPOMA EXCISION  08/01/2012   Procedure: EXCISION LIPOMA;  Surgeon: Rolm Bookbinder, MD;  Location: [place];  Service: General;  Laterality: N/A;  EXCISION LIPOMAS X 6 (LEFT TRICEP, RIGHT TRICEP, DELTOID, BIL ABDOMINAL WALL AND LEFT THIGH)   NASAL SINUS SURGERY     SHOULDER SURGERY     right    No Known Allergies  Outpatient Encounter Medications as of 06/09/2021  Medication Sig   acetaminophen (TYLENOL) 500 MG tablet Take 1,000 mg by mouth 3 (three) times daily.   buprenorphine (BUTRANS) 10 MCG/HR PTWK Place 1 patch onto the skin once a week.   cetirizine (ZYRTEC) 10 MG tablet Take 10 mg by mouth daily.   divalproex (DEPAKOTE) 125 MG DR tablet Take 125 mg by mouth in the morning, at noon, and at bedtime.  donepezil (ARICEPT) 10 MG tablet Take 20 mg by mouth every morning.   DULoxetine (CYMBALTA) 60 MG capsule Take 60 mg by mouth 2 (two) times daily.   hydrochlorothiazide (HYDRODIURIL) 25 MG tablet Take 25 mg by mouth daily.   Lidocaine 4 % PTCH Apply 1 patch topically daily as needed.   LORazepam (ATIVAN) 0.5 MG tablet Take 1 tablet (0.5 mg total) by mouth daily.   losartan (COZAAR) 100 MG tablet Take 100 mg by mouth daily.   meloxicam (MOBIC) 7.5 MG tablet Take 7.5 mg by mouth daily.   memantine (NAMENDA) 10 MG tablet Take 10 mg by mouth 2 (two) times daily.   omeprazole (PRILOSEC) 20 MG capsule Take  20 mg by mouth daily.   pravastatin (PRAVACHOL) 40 MG tablet Take 40 mg by mouth daily.   pregabalin (LYRICA) 100 MG capsule Take 1 capsule (100 mg total) by mouth 2 (two) times daily.   tamsulosin (FLOMAX) 0.4 MG CAPS capsule Take 0.4 mg by mouth daily.   zinc oxide 20 % ointment Apply 1 application topically as needed for irritation.   No facility-administered encounter medications on file as of 06/09/2021.    Review of Systems:  Review of Systems  Unable to perform ROS: Dementia   Health Maintenance  Topic Date Due   Pneumonia Vaccine 63+ Years old (1 - PCV) Never done   Hepatitis C Screening  Never done   COLONOSCOPY (Pts 45-39yrs Insurance coverage will need to be confirmed)  Never done   Zoster Vaccines- Shingrix (1 of 2) 05/06/2005   TETANUS/TDAP  08/01/2020   INFLUENZA VACCINE  Completed   COVID-19 Vaccine  Completed   HPV VACCINES  Aged Out    Physical Exam: Vitals:   06/09/21 1436  BP: 117/82  Pulse: 65  Resp: 12  Temp: (!) 97.2 F (36.2 C)  SpO2: 97%  Weight: 188 lb 14.4 oz (85.7 kg)  Height: 6\' 4"  (1.93 m)   Body mass index is 22.99 kg/m. Physical Exam Vitals reviewed.  Constitutional:      Appearance: Normal appearance.  HENT:     Head: Normocephalic.     Mouth/Throat:     Mouth: Mucous membranes are moist.     Pharynx: Oropharynx is clear.  Eyes:     Pupils: Pupils are equal, round, and reactive to light.  Cardiovascular:     Rate and Rhythm: Normal rate and regular rhythm.     Pulses: Normal pulses.     Heart sounds: No murmur heard. Pulmonary:     Effort: Pulmonary effort is normal. No respiratory distress.     Breath sounds: Normal breath sounds. No rales.  Abdominal:     General: Abdomen is flat. Bowel sounds are normal.     Palpations: Abdomen is soft.  Musculoskeletal:        General: No swelling.     Cervical back: Neck supple.  Skin:    General: Skin is warm.  Neurological:     General: No focal deficit present.     Mental  Status: He is alert.  Psychiatric:        Mood and Affect: Mood normal.        Thought Content: Thought content normal.    Labs reviewed: Basic Metabolic Panel: Recent Labs    09/13/20 0000 01/27/21 0000 03/24/21 0000  NA 143 145 144  K 3.8 3.7 3.8  CL 108 106 106  CO2 29* 32* 34*  BUN 21 25* 24*  CREATININE 1.2 1.1  1.0  CALCIUM 9.4 9.5 9.2  TSH 1.46  --   --    Liver Function Tests: Recent Labs    01/06/21 0000 01/24/21 0000 01/27/21 0000  AST 17 15 12*  ALT 21 16 13   ALKPHOS 51 57 55  ALBUMIN 3.8 3.7 3.9   No results for input(s): LIPASE, AMYLASE in the last 8760 hours. No results for input(s): AMMONIA in the last 8760 hours. CBC: Recent Labs    09/13/20 0000 10/28/20 0000 12/14/20 0000 01/24/21 0000 01/27/21 0000  WBC 6.5   < > 7.5 6.3 5.8  NEUTROABS 4,264.00  --   --   --  3,579.00  HGB 15.5   < > 15.5 14.9 14.4  HCT 46   < > 47 44 43  PLT 246   < > 319 279 267   < > = values in this interval not displayed.   Lipid Panel: Recent Labs    09/13/20 0000  CHOL 177  HDL 45  LDLCALC 109   No results found for: HGBA1C  Procedures since last visit: No results found.  Assessment/Plan 1. Early onset Alzheimer's dementia with behavioral disturbance ([place]) Needs Help with his ADLS Cannot ambulate safely On Aricept and Namenda 2. Primary hypertension On Cozaar and HCTZ 3. Pure hypercholesterolemia Continue on Statin LDL 109 4. Chronic low back pain, unspecified back pain laterality, unspecified whether sciatica present On Butrans and Meloxicam 5. Weight loss Weight has stabilized right now Enrolled iN hospice 6. Benign prostatic hyperplasia, unspecified whether lower urinary tract symptoms present Continue on Flomax 7. Idiopathic peripheral neuropathy Contineuon Lyrica 8. Depression, recurrent ([place]) On Cymbalt and Low dose of Ativan 9. Gastroesophageal reflux disease without esophagitis Continue Prilosec 10. Recurrent falls Supportive  care   Labs/tests ordered:  * No order type specified *  Virgie Dad, MD

## 2021-06-16 ENCOUNTER — Non-Acute Institutional Stay (SKILLED_NURSING_FACILITY): Payer: Medicare Other | Admitting: Internal Medicine

## 2021-06-16 ENCOUNTER — Encounter: Payer: Self-pay | Admitting: Internal Medicine

## 2021-06-16 DIAGNOSIS — M545 Low back pain, unspecified: Secondary | ICD-10-CM | POA: Diagnosis not present

## 2021-06-16 DIAGNOSIS — N400 Enlarged prostate without lower urinary tract symptoms: Secondary | ICD-10-CM

## 2021-06-16 DIAGNOSIS — I10 Essential (primary) hypertension: Secondary | ICD-10-CM

## 2021-06-16 DIAGNOSIS — G8929 Other chronic pain: Secondary | ICD-10-CM

## 2021-06-16 DIAGNOSIS — F02818 Dementia in other diseases classified elsewhere, unspecified severity, with other behavioral disturbance: Secondary | ICD-10-CM

## 2021-06-16 DIAGNOSIS — G300 Alzheimer's disease with early onset: Secondary | ICD-10-CM

## 2021-06-16 DIAGNOSIS — E7800 Pure hypercholesterolemia, unspecified: Secondary | ICD-10-CM | POA: Diagnosis not present

## 2021-06-16 NOTE — Progress Notes (Signed)
Location:   [place] Room Number: 8 Place of Service:  SNF [phone]) Provider:  Veleta Miners MD  Virgie Dad, MD  Patient Care Team: Virgie Dad, MD as PCP - General (Internal Medicine)  Extended Emergency Contact Information Primary Emergency Contact: Shor,Donna Address: [address]          [place], [place] [address] Johnnette Litter of [place] Phone: [phone] Mobile Phone: [phone] Relation: Spouse  Code Status:  DNR Hospice Goals of care: Advanced Directive information Advanced Directives 06/16/2021  Does Patient Have a Medical Advance Directive? Yes  Type of Paramedic of [place];Living will;Out of facility DNR (pink MOST or yellow form)  Does patient want to make changes to medical advance directive? No - Patient declined  Copy of [place] in Chart? Yes - validated most recent copy scanned in chart (See row information)  Would patient like information on creating a medical advance directive? -  Pre-existing out of facility DNR order (yellow form or pink MOST form) Yellow form placed in chart (order not valid for inpatient use)     Chief Complaint  Patient presents with   Acute Visit    Resisting care    HPI:  Pt is a 65 y.o. male seen today for an acute visit for Behaviors and Resisting care including hitting staff  Patient has a history of Alzheimer's dementia with behavioral disturbance.  Has had extensive work-up in [facility]. Was diagnosed at age of 19.  Had MRI done which showed severe atrophy and small vessel ischemia.  Has been on Aricept and Namenda. His wife who is the main caregiver has moved him here due to his underlying physical decline and cognitive decline Has history of chronic back pain MRI has shown lumbar stenosis lat L3-4, L4-5. Prior L2 compression fracture On high doses of Lyrica and started on Butrans per Neurology   Seen today as he was trying to hit  the staff when they were trying to provide him care Wife in the room very upset This is second episode in past few weeks He looked comfortable no other issues  Past Medical History:  Diagnosis Date   Hyperlipidemia    Hypertension    Seasonal allergies    Past Surgical History:  Procedure Laterality Date   HAMMERTOE RECONSTRUCTION WITH WEIL OSTEOTOMY  08/01/2012   Procedure: HAMMERTOE RECONSTRUCTION WITH WEIL OSTEOTOMY;  Surgeon: Wylene Simmer, MD;  Location: [place];  Service: Orthopedics;  Laterality: Right;  RIGHT SCARF OSTEOTOMY, MODIFIED MCBRIDE BUNIONECTOMY, , RIGHT 2ND - 4TH WEIL, RIGHT 2ND - 5TH DORSAL CAPSULOTOMIES, RIGHT 2ND - 4TH PIP ARTHRODESIS   LIPOMA EXCISION  08/01/2012   Procedure: EXCISION LIPOMA;  Surgeon: Rolm Bookbinder, MD;  Location: [place];  Service: General;  Laterality: N/A;  EXCISION LIPOMAS X 6 (LEFT TRICEP, RIGHT TRICEP, DELTOID, BIL ABDOMINAL WALL AND LEFT THIGH)   NASAL SINUS SURGERY     SHOULDER SURGERY     right    No Known Allergies  Allergies as of 06/16/2021   No Known Allergies      Medication List        Accurate as of June 16, 2021 11:46 AM. If you have any questions, ask your nurse or doctor.          acetaminophen 500 MG tablet Commonly known as: TYLENOL Take 1,000 mg by mouth 3 (three) times daily.   buprenorphine 10 MCG/HR Ptwk Commonly known  as: BUTRANS Place 1 patch onto the skin once a week.   cetirizine 10 MG tablet Commonly known as: ZYRTEC Take 10 mg by mouth daily.   divalproex 125 MG DR tablet Commonly known as: DEPAKOTE Take 125 mg by mouth in the morning, at noon, and at bedtime.   donepezil 10 MG tablet Commonly known as: ARICEPT Take 20 mg by mouth every morning.   DULoxetine 60 MG capsule Commonly known as: CYMBALTA Take 60 mg by mouth 2 (two) times daily.   hydrochlorothiazide 25 MG tablet Commonly known as: HYDRODIURIL Take 25 mg by mouth daily.   Lidocaine  4 % Ptch Apply 1 patch topically daily as needed.   LORazepam 0.5 MG tablet Commonly known as: ATIVAN Take 1 tablet (0.5 mg total) by mouth daily.   losartan 100 MG tablet Commonly known as: COZAAR Take 100 mg by mouth daily.   meloxicam 7.5 MG tablet Commonly known as: MOBIC Take 7.5 mg by mouth daily.   memantine 10 MG tablet Commonly known as: NAMENDA Take 10 mg by mouth 2 (two) times daily.   omeprazole 20 MG capsule Commonly known as: PRILOSEC Take 20 mg by mouth daily.   pravastatin 40 MG tablet Commonly known as: PRAVACHOL Take 40 mg by mouth daily.   pregabalin 100 MG capsule Commonly known as: LYRICA Take 1 capsule (100 mg total) by mouth 2 (two) times daily.   tamsulosin 0.4 MG Caps capsule Commonly known as: FLOMAX Take 0.4 mg by mouth daily.   zinc oxide 20 % ointment Apply 1 application topically as needed for irritation.        Review of Systems  Unable to perform ROS: Dementia   Immunization History  Administered Date(s) Administered   Influenza Split 06/09/2015, 04/04/2016, 02/27/2017   Influenza,inj,Quad PF,6-35 Mos 04/03/2019, 04/06/2020   Influenza-Unspecified 05/04/2021   PFIZER Comirnaty(Gray Top)Covid-19 Tri-Sucrose Vaccine 12/15/2020   PFIZER(Purple Top)SARS-COV-2 Vaccination 08/25/2019, 09/16/2019, 05/03/2020   Pfizer Covid-19 Vaccine Bivalent Booster 5yrs & up 03/30/2021   Tdap 08/01/2010   Zoster, Live 08/24/2016   Zoster, Unspecified 08/24/2016   Pertinent  Health Maintenance Due  Topic Date Due   COLONOSCOPY (Pts 45-90yrs Insurance coverage will need to be confirmed)  Never done   INFLUENZA VACCINE  Completed   Fall Risk 03/01/2015 03/12/2015  Falls in the past year? No No  Patient at Risk for Falls Due to - Other (Comment)   Functional Status Survey:    Vitals:   06/16/21 1141  BP: 116/77  Pulse: 66  Resp: 18  Temp: (!) 97.1 F (36.2 C)  SpO2: 95%  Weight: 188 lb 14.4 oz (85.7 kg)  Height: 6\' 4"  (1.93 m)   Body  mass index is 22.99 kg/m. Physical Exam Vitals reviewed.  Constitutional:      Appearance: Normal appearance.  HENT:     Head: Normocephalic.     Mouth/Throat:     Mouth: Mucous membranes are moist.     Pharynx: Oropharynx is clear.  Eyes:     Pupils: Pupils are equal, round, and reactive to light.  Cardiovascular:     Rate and Rhythm: Normal rate and regular rhythm.     Pulses: Normal pulses.     Heart sounds: No murmur heard. Pulmonary:     Effort: Pulmonary effort is normal. No respiratory distress.     Breath sounds: Normal breath sounds. No rales.  Abdominal:     General: Abdomen is flat. Bowel sounds are normal.     Palpations:  Abdomen is soft.  Musculoskeletal:        General: No swelling.     Cervical back: Neck supple.  Skin:    General: Skin is warm.  Neurological:     General: No focal deficit present.     Mental Status: He is alert.  Psychiatric:        Mood and Affect: Mood normal.        Thought Content: Thought content normal.    Labs reviewed: Recent Labs    09/13/20 0000 01/27/21 0000 03/24/21 0000  NA 143 145 144  K 3.8 3.7 3.8  CL 108 106 106  CO2 29* 32* 34*  BUN 21 25* 24*  CREATININE 1.2 1.1 1.0  CALCIUM 9.4 9.5 9.2   Recent Labs    01/06/21 0000 01/24/21 0000 01/27/21 0000  AST 17 15 12*  ALT 21 16 13   ALKPHOS 51 57 55  ALBUMIN 3.8 3.7 3.9   Recent Labs    09/13/20 0000 10/28/20 0000 12/14/20 0000 01/24/21 0000 01/27/21 0000  WBC 6.5   < > 7.5 6.3 5.8  NEUTROABS 4,264.00  --   --   --  3,579.00  HGB 15.5   < > 15.5 14.9 14.4  HCT 46   < > 47 44 43  PLT 246   < > 319 279 267   < > = values in this interval not displayed.   Lab Results  Component Value Date   TSH 1.46 09/13/2020   No results found for: HGBA1C Lab Results  Component Value Date   CHOL 177 09/13/2020   HDL 45 09/13/2020   LDLCALC 109 09/13/2020    Significant Diagnostic Results in last 30 days:  No results found.  Assessment/Plan 1 Early onset  Alzheimer's dementia with behavioral disturbance ([place]) Will start on Risperdal 0.25 mg in am for 2 weeks and then reval Would not change Depakote as he got sleepy last time with increased dose Also change ativan in am to before his Care On high dose of Aricept and Namenda  Other issues 2 Essential Hypertension On Cozaar and HCTZ 3. Pure hypercholesterolemia Continue on Statin LDL 109 4. Chronic low back pain, unspecified back pain laterality, unspecified whether sciatica present On Butrans and Meloxicam 5. Weight loss Weight has stabilized right now Enrolled iN hospice 6. Benign prostatic hyperplasia, unspecified whether lower urinary tract symptoms present Continue on Flomax 7. Idiopathic peripheral neuropathy Contineuon Lyrica 8. Depression, recurrent ([place]) On Cymbalt and Low dose of Ativan 9. Gastroesophageal reflux disease without esophagitis Continue Prilosec 10. Recurrent falls Supportive care  Family/ staff Communication:   Labs/tests ordered:

## 2021-06-24 ENCOUNTER — Encounter: Payer: Self-pay | Admitting: Orthopedic Surgery

## 2021-06-24 ENCOUNTER — Non-Acute Institutional Stay (SKILLED_NURSING_FACILITY): Payer: Medicare Other | Admitting: Orthopedic Surgery

## 2021-06-24 DIAGNOSIS — M545 Low back pain, unspecified: Secondary | ICD-10-CM

## 2021-06-24 DIAGNOSIS — E7800 Pure hypercholesterolemia, unspecified: Secondary | ICD-10-CM | POA: Diagnosis not present

## 2021-06-24 DIAGNOSIS — I10 Essential (primary) hypertension: Secondary | ICD-10-CM | POA: Diagnosis not present

## 2021-06-24 DIAGNOSIS — G609 Hereditary and idiopathic neuropathy, unspecified: Secondary | ICD-10-CM

## 2021-06-24 DIAGNOSIS — K219 Gastro-esophageal reflux disease without esophagitis: Secondary | ICD-10-CM

## 2021-06-24 DIAGNOSIS — G301 Alzheimer's disease with late onset: Secondary | ICD-10-CM

## 2021-06-24 DIAGNOSIS — F03911 Unspecified dementia, unspecified severity, with agitation: Secondary | ICD-10-CM

## 2021-06-24 DIAGNOSIS — N400 Enlarged prostate without lower urinary tract symptoms: Secondary | ICD-10-CM

## 2021-06-24 DIAGNOSIS — G8929 Other chronic pain: Secondary | ICD-10-CM

## 2021-06-24 DIAGNOSIS — F02818 Dementia in other diseases classified elsewhere, unspecified severity, with other behavioral disturbance: Secondary | ICD-10-CM

## 2021-06-24 DIAGNOSIS — F339 Major depressive disorder, recurrent, unspecified: Secondary | ICD-10-CM

## 2021-06-24 NOTE — Progress Notes (Signed)
Location:   [facility] Room Number: 8 Place of Service:  SNF [phone]) Provider:  Windell Moulding, NP  Virgie Dad, MD  Patient Care Team: Virgie Dad, MD as PCP - General (Internal Medicine)  Extended Emergency Contact Information Primary Emergency Contact: Morabito,Donna Address: [address]          [place], [place] [address] Johnnette Litter of [place] Phone: [phone] Mobile Phone: [phone] Relation: Spouse  Code Status:  DNR Goals of care: Advanced Directive information Advanced Directives 06/24/2021  Does Patient Have a Medical Advance Directive? Yes  Type of Paramedic of [place];Living will;Out of facility DNR (pink MOST or yellow form)  Does patient want to make changes to medical advance directive? No - Patient declined  Copy of [place] in Chart? Yes - validated most recent copy scanned in chart (See row information)  Would patient like information on creating a medical advance directive? -  Pre-existing out of facility DNR order (yellow form or pink MOST form) Yellow form placed in chart (order not valid for inpatient use);Pink MOST form placed in chart (order not valid for inpatient use)     Chief Complaint  Patient presents with   Acute Visit    Agitation    HPI:  Pt is a 65 y.o. male seen today for an acute visit for agitation.   He continues to reside on the skilled nursing unit at [facility] due to Alzheimer's dementia. Past medical history includes: HTN, peripheral neuropathy, BPH, chronic back pain, depression HLD, seasonal allergies, and hyperbilirubinemia.  Agitation- nursing reports increased agitation the past 4 days, behaviors of grabbing/yelling and hitting observed by staff, behaviors more prevalent during ALDs like bathing and changing, remains on scheduled ativan and haldol prn Alzheimer's- enrolled in hospice last month, continues to have poor safety awareness, bed  bound, remains on Depakote and Namenda HTN- BUN/creat 24/1.0 03/24/2021, remains on HCTZ and losartan HLD- LDL 109 09/2020, remains on statin Chronic back pain- remains on Butrans weekly patches, lidocaine patches, meloxicam and scheduled tylenol BPH- remains on Flomax Peripheral neuropathy- recently started on gabapentin by hospice, Lyrica discontinued Depression- remains on Cymbalta GERD- hgb 14.4 01/2021, remains on Prilosec    Past Medical History:  Diagnosis Date   Hyperlipidemia    Hypertension    Seasonal allergies    Past Surgical History:  Procedure Laterality Date   HAMMERTOE RECONSTRUCTION WITH WEIL OSTEOTOMY  08/01/2012   Procedure: HAMMERTOE RECONSTRUCTION WITH WEIL OSTEOTOMY;  Surgeon: Wylene Simmer, MD;  Location: [facility];  Service: Orthopedics;  Laterality: Right;  RIGHT SCARF OSTEOTOMY, MODIFIED MCBRIDE BUNIONECTOMY, , RIGHT 2ND - 4TH WEIL, RIGHT 2ND - 5TH DORSAL CAPSULOTOMIES, RIGHT 2ND - 4TH PIP ARTHRODESIS   LIPOMA EXCISION  08/01/2012   Procedure: EXCISION LIPOMA;  Surgeon: Rolm Bookbinder, MD;  Location: [facility];  Service: General;  Laterality: N/A;  EXCISION LIPOMAS X 6 (LEFT TRICEP, RIGHT TRICEP, DELTOID, BIL ABDOMINAL WALL AND LEFT THIGH)   NASAL SINUS SURGERY     SHOULDER SURGERY     right    No Known Allergies  Allergies as of 06/24/2021   No Known Allergies      Medication List        Accurate as of June 24, 2021  1:52 PM. If you have any questions, ask your nurse or doctor.          STOP taking these medications    donepezil  10 MG tablet Commonly known as: ARICEPT Stopped by: Yvonna Alanis, NP   pregabalin 100 MG capsule Commonly known as: LYRICA Stopped by: Yvonna Alanis, NP       TAKE these medications    acetaminophen 500 MG tablet Commonly known as: TYLENOL Take 1,000 mg by mouth 3 (three) times daily.   buprenorphine 10 MCG/HR Ptwk Commonly known as: BUTRANS Place 1 patch onto the  skin once a week.   cetirizine 10 MG tablet Commonly known as: ZYRTEC Take 10 mg by mouth daily.   divalproex 125 MG DR tablet Commonly known as: DEPAKOTE Take 125 mg by mouth in the morning, at noon, and at bedtime.   DULoxetine 60 MG capsule Commonly known as: CYMBALTA Take 60 mg by mouth 2 (two) times daily.   gabapentin 300 MG capsule Commonly known as: NEURONTIN Take 300 mg by mouth daily.   haloperidol 0.5 MG tablet Commonly known as: HALDOL Take 0.5 mg by mouth every 6 (six) hours as needed for agitation.   hydrochlorothiazide 25 MG tablet Commonly known as: HYDRODIURIL Take 25 mg by mouth daily.   Lidocaine 4 % Ptch Apply 1 patch topically daily as needed.   LORazepam 0.5 MG tablet Commonly known as: ATIVAN Take 1 tablet (0.5 mg total) by mouth daily.   losartan 100 MG tablet Commonly known as: COZAAR Take 100 mg by mouth daily.   meloxicam 7.5 MG tablet Commonly known as: MOBIC Take 7.5 mg by mouth daily.   memantine 10 MG tablet Commonly known as: NAMENDA Take 10 mg by mouth. 10 mg po QOD x 3 doses then 10 mg po q3days for 3 doses then D/C.   omeprazole 20 MG capsule Commonly known as: PRILOSEC Take 20 mg by mouth daily.   pravastatin 40 MG tablet Commonly known as: PRAVACHOL Take 40 mg by mouth daily.   risperiDONE 0.25 MG tablet Commonly known as: RISPERDAL Take 0.25 mg by mouth daily. Give 0.25mg  PO QAM x2 weeks then re-evaluate effectiveness.   tamsulosin 0.4 MG Caps capsule Commonly known as: FLOMAX Take 0.4 mg by mouth daily.   zinc oxide 20 % ointment Apply 1 application topically as needed for irritation.        Review of Systems  Unable to perform ROS: Dementia   Immunization History  Administered Date(s) Administered   Influenza Split 06/09/2015, 04/04/2016, 02/27/2017   Influenza,inj,Quad PF,6-35 Mos 04/03/2019, 04/06/2020   Influenza-Unspecified 05/04/2021   PFIZER Comirnaty(Gray Top)Covid-19 Tri-Sucrose Vaccine  12/15/2020   PFIZER(Purple Top)SARS-COV-2 Vaccination 08/25/2019, 09/16/2019, 05/03/2020   Pfizer Covid-19 Vaccine Bivalent Booster 71yrs & up 03/30/2021   Tdap 08/01/2010   Zoster, Live 08/24/2016   Zoster, Unspecified 08/24/2016   Pertinent  Health Maintenance Due  Topic Date Due   COLONOSCOPY (Pts 45-41yrs Insurance coverage will need to be confirmed)  Never done   INFLUENZA VACCINE  Completed   Fall Risk 03/01/2015 03/12/2015  Falls in the past year? No No  Patient at Risk for Falls Due to - Other (Comment)   Functional Status Survey:    Vitals:   06/24/21 1338  BP: 116/80  Pulse: 86  Resp: 12  Temp: (!) 97.2 F (36.2 C)  SpO2: 97%  Weight: 188 lb 14.4 oz (85.7 kg)  Height: 6\' 4"  (1.93 m)   Body mass index is 22.99 kg/m. Physical Exam Vitals reviewed.  Constitutional:      General: He is not in acute distress. HENT:     Head: Normocephalic.  Eyes:  General:        Right eye: No discharge.        Left eye: No discharge.  Cardiovascular:     Rate and Rhythm: Normal rate and regular rhythm.     Pulses: Normal pulses.     Heart sounds: Normal heart sounds. No murmur heard. Pulmonary:     Effort: Pulmonary effort is normal. No respiratory distress.     Breath sounds: Normal breath sounds. No wheezing.  Abdominal:     General: Bowel sounds are normal. There is no distension.     Palpations: Abdomen is soft.     Tenderness: There is no abdominal tenderness.  Musculoskeletal:     Right lower leg: No edema.     Left lower leg: No edema.  Skin:    General: Skin is warm and dry.     Capillary Refill: Capillary refill takes less than 2 seconds.  Neurological:     General: No focal deficit present.     Mental Status: He is alert. Mental status is at baseline.     Motor: Weakness present.     Gait: Gait abnormal.     Comments: Bed bound  Psychiatric:        Mood and Affect: Mood normal. Affect is flat.        Behavior: Behavior is cooperative.         Cognition and Memory: Memory is impaired.     Comments: Alert to self, followed commands, cannot express needs, gave short "yes/no" responses    Labs reviewed: Recent Labs    09/13/20 0000 01/27/21 0000 03/24/21 0000  NA 143 145 144  K 3.8 3.7 3.8  CL 108 106 106  CO2 29* 32* 34*  BUN 21 25* 24*  CREATININE 1.2 1.1 1.0  CALCIUM 9.4 9.5 9.2   Recent Labs    01/06/21 0000 01/24/21 0000 01/27/21 0000  AST 17 15 12*  ALT 21 16 13   ALKPHOS 51 57 55  ALBUMIN 3.8 3.7 3.9   Recent Labs    09/13/20 0000 10/28/20 0000 12/14/20 0000 01/24/21 0000 01/27/21 0000  WBC 6.5   < > 7.5 6.3 5.8  NEUTROABS 4,264.00  --   --   --  3,579.00  HGB 15.5   < > 15.5 14.9 14.4  HCT 46   < > 47 44 43  PLT 246   < > 319 279 267   < > = values in this interval not displayed.   Lab Results  Component Value Date   TSH 1.46 09/13/2020   No results found for: HGBA1C Lab Results  Component Value Date   CHOL 177 09/13/2020   HDL 45 09/13/2020   LDLCALC 109 09/13/2020    Significant Diagnostic Results in last 30 days:  No results found.  Assessment/Plan 1. Agitation due to dementia - grabbing/hitting/yelling x 4 days- increased with ADLs/ movement - Lyrica recently discontinued- increased back pain? - discontinued scheduled ativan 0.5 mg QAM - start haldol 0.5 po bid - start ativan 0.5 mg po bid prn  - increase Depakote 250 mg po Qhs  2. Late onset Alzheimer's disease with behavioral disturbance ([place]) - see above - followed by hospice - cont skilled nursing care - cont depakote 125 mg po Qam and afternoon - cont namenda  3. Primary hypertension - controlled - cont HCTZ and losartan  4. Pure hypercholesterolemia - cont statin  5. Chronic low back pain, unspecified back pain laterality, unspecified whether sciatica present -  cont Butrans patches, lidocaine patches, meloxicam and scheduled tylenol  6. Benign prostatic hyperplasia, unspecified whether lower urinary tract  symptoms present - cont Flomax  7. Idiopathic peripheral neuropathy - recently started on gabapentin - d/c gabapentin - restart Lyrica 100 mg po bid  8. Depression, recurrent ([place]) - cont Cymbalta  9. Gastroesophageal reflux disease without esophagitis - cont Prilosec     Family/ staff Communication: plan discussed with nurse  Labs/tests ordered:   none

## 2021-07-13 ENCOUNTER — Non-Acute Institutional Stay (SKILLED_NURSING_FACILITY): Payer: Medicare Other | Admitting: Orthopedic Surgery

## 2021-07-13 ENCOUNTER — Encounter: Payer: Self-pay | Admitting: Orthopedic Surgery

## 2021-07-13 DIAGNOSIS — R634 Abnormal weight loss: Secondary | ICD-10-CM

## 2021-07-13 DIAGNOSIS — F339 Major depressive disorder, recurrent, unspecified: Secondary | ICD-10-CM

## 2021-07-13 DIAGNOSIS — G301 Alzheimer's disease with late onset: Secondary | ICD-10-CM | POA: Diagnosis not present

## 2021-07-13 DIAGNOSIS — E7800 Pure hypercholesterolemia, unspecified: Secondary | ICD-10-CM

## 2021-07-13 DIAGNOSIS — I10 Essential (primary) hypertension: Secondary | ICD-10-CM | POA: Diagnosis not present

## 2021-07-13 DIAGNOSIS — N400 Enlarged prostate without lower urinary tract symptoms: Secondary | ICD-10-CM

## 2021-07-13 DIAGNOSIS — G8929 Other chronic pain: Secondary | ICD-10-CM

## 2021-07-13 DIAGNOSIS — G609 Hereditary and idiopathic neuropathy, unspecified: Secondary | ICD-10-CM

## 2021-07-13 DIAGNOSIS — F03911 Unspecified dementia, unspecified severity, with agitation: Secondary | ICD-10-CM

## 2021-07-13 DIAGNOSIS — F02818 Dementia in other diseases classified elsewhere, unspecified severity, with other behavioral disturbance: Secondary | ICD-10-CM

## 2021-07-13 DIAGNOSIS — K219 Gastro-esophageal reflux disease without esophagitis: Secondary | ICD-10-CM

## 2021-07-13 DIAGNOSIS — M545 Low back pain, unspecified: Secondary | ICD-10-CM

## 2021-07-13 NOTE — Progress Notes (Signed)
Location:   [facility] Room Number: 08 Place of Service:  SNF ([phone]) Provider:  Yvonna Alanis, NP  Virgie Dad, MD  Patient Care Team: Virgie Dad, MD as PCP - General (Internal Medicine)  Extended Emergency Contact Information Primary Emergency Contact: Verstraete,Donna Address: [address]          [place], [place] [address] Johnnette Litter of [place] Phone: [phone] Mobile Phone: [phone] Relation: Spouse  Code Status:  DNR Hospice Goals of care: Advanced Directive information Advanced Directives 07/13/2021  Does Patient Have a Medical Advance Directive? Yes  Type of Paramedic of [place];Living will;Out of facility DNR (pink MOST or yellow form)  Does patient want to make changes to medical advance directive? No - Patient declined  Copy of [place] in Chart? Yes - validated most recent copy scanned in chart (See row information)  Would patient like information on creating a medical advance directive? -  Pre-existing out of facility DNR order (yellow form or pink MOST form) Yellow form placed in chart (order not valid for inpatient use);Pink MOST form placed in chart (order not valid for inpatient use)     Chief Complaint  Patient presents with   Medical Management of Chronic Issues   Quality Metric Gaps    Pneumonia Vaccine 34+ Years old (1 - PCV)   Hepatitis C Screening (Once) COLONOSCOPY (Pts 45-15yrs Insurance coverage will need to be confirmed) (Every 10 Years) Zoster Vaccines- Shingrix (1 of 2) TETANUS/TDAP      HPI:  Pt is a 66 y.o. male seen today for medical management of chronic diseases.    He continues to reside on the skilled nursing unit at [facility] due to Alzheimer's dementia. Past medical history includes: HTN, peripheral neuropathy, BPH, chronic back pain, depression HLD, seasonal allergies, and hyperbilirubinemia.   Agitation- increased behaviors began  07/11/2020, nurse aide reports increased agitation with daily care, he is swinging his arms during bathing, also grabbing staff at times, evening Depakote increased last month, remains on Haldol and ativan prn Alzheimer's- enrolled in hospice 05/2021, continues to have poor safety awareness, bed bound, aphasia, remains on Depakote  HTN- BUN/creat 24/1.0 03/24/2021, remains on HCTZ and losartan HLD- LDL 109 09/2020, remains on statin Chronic back pain- remains on Butrans weekly patches, lidocaine patches, meloxicam and scheduled tylenol BPH- remains on Flomax Peripheral neuropathy- Lyrica discontinued and gabapentin started per hospice- he had increased agitation, he was restarted back on Lyrica last month Depression- remains on Cymbalta GERD- hgb 14.4 01/2021, remains on Prilosec Weight loss- see below  No recent falls or injuries. Transfers from bed to recliner with staff assist.   Recent blood pressures:  01/03- 126/84  01/01- 144/98  12/27- 139/88  Recent weights:  01/01- January weight not recorded  12/01- 188.9 lbs  11/07- 181.9 lbs  10/10- 181.5 lbs  Past Medical History:  Diagnosis Date   Hyperlipidemia    Hypertension    Seasonal allergies    Past Surgical History:  Procedure Laterality Date   HAMMERTOE RECONSTRUCTION WITH WEIL OSTEOTOMY  08/01/2012   Procedure: HAMMERTOE RECONSTRUCTION WITH WEIL OSTEOTOMY;  Surgeon: Wylene Simmer, MD;  Location: [place];  Service: Orthopedics;  Laterality: Right;  RIGHT SCARF OSTEOTOMY, MODIFIED MCBRIDE BUNIONECTOMY, , RIGHT 2ND - 4TH WEIL, RIGHT 2ND - 5TH DORSAL CAPSULOTOMIES, RIGHT 2ND - 4TH PIP ARTHRODESIS   LIPOMA EXCISION  08/01/2012   Procedure: EXCISION LIPOMA;  Surgeon: Rolm Bookbinder,  MD;  Location: [facility];  Service: General;  Laterality: N/A;  EXCISION LIPOMAS X 6 (LEFT TRICEP, RIGHT TRICEP, DELTOID, BIL ABDOMINAL WALL AND LEFT THIGH)   NASAL SINUS SURGERY     SHOULDER SURGERY     right     No Known Allergies  Allergies as of 07/13/2021   No Known Allergies      Medication List        Accurate as of July 13, 2021 10:16 AM. If you have any questions, ask your nurse or doctor.          STOP taking these medications    gabapentin 300 MG capsule Commonly known as: NEURONTIN Stopped by: Yvonna Alanis, NP   LORazepam 0.5 MG tablet Commonly known as: ATIVAN Stopped by: Yvonna Alanis, NP   memantine 10 MG tablet Commonly known as: NAMENDA Stopped by: Yvonna Alanis, NP   risperiDONE 0.25 MG tablet Commonly known as: RISPERDAL Stopped by: Yvonna Alanis, NP       TAKE these medications    acetaminophen 500 MG tablet Commonly known as: TYLENOL Take 1,000 mg by mouth 3 (three) times daily.   buprenorphine 10 MCG/HR Ptwk Commonly known as: BUTRANS Place 1 patch onto the skin once a week.   cetirizine 10 MG tablet Commonly known as: ZYRTEC Take 10 mg by mouth daily.   divalproex 125 MG DR tablet Commonly known as: DEPAKOTE Take 125 mg by mouth in the morning, at noon, and at bedtime.   DULoxetine 60 MG capsule Commonly known as: CYMBALTA Take 60 mg by mouth 2 (two) times daily.   haloperidol 0.5 MG tablet Commonly known as: HALDOL Take 0.5 mg by mouth every 6 (six) hours as needed for agitation.   hydrochlorothiazide 25 MG tablet Commonly known as: HYDRODIURIL Take 25 mg by mouth daily.   Lidocaine 4 % Ptch Apply 1 patch topically daily as needed.   losartan 100 MG tablet Commonly known as: COZAAR Take 100 mg by mouth daily.   meloxicam 7.5 MG tablet Commonly known as: MOBIC Take 7.5 mg by mouth daily.   omeprazole 20 MG capsule Commonly known as: PRILOSEC Take 20 mg by mouth daily.   pravastatin 40 MG tablet Commonly known as: PRAVACHOL Take 40 mg by mouth daily.   pregabalin 100 MG capsule Commonly known as: LYRICA Take 100 mg by mouth 2 (two) times daily.   tamsulosin 0.4 MG Caps capsule Commonly known as: FLOMAX Take 0.4 mg  by mouth daily.   zinc oxide 20 % ointment Apply 1 application topically as needed for irritation.        Review of Systems  Unable to perform ROS: Dementia   Immunization History  Administered Date(s) Administered   Influenza Split 06/09/2015, 04/04/2016, 02/27/2017   Influenza,inj,Quad PF,6-35 Mos 04/03/2019, 04/06/2020   Influenza-Unspecified 05/04/2021   PFIZER Comirnaty(Gray Top)Covid-19 Tri-Sucrose Vaccine 12/15/2020   PFIZER(Purple Top)SARS-COV-2 Vaccination 08/25/2019, 09/16/2019, 05/03/2020   Pfizer Covid-19 Vaccine Bivalent Booster 81yrs & up 03/30/2021   Tdap 08/01/2010   Zoster, Live 08/24/2016   Zoster, Unspecified 08/24/2016   Pertinent  Health Maintenance Due  Topic Date Due   COLONOSCOPY (Pts 45-60yrs Insurance coverage will need to be confirmed)  Never done   INFLUENZA VACCINE  Completed   Fall Risk 03/01/2015 03/12/2015  Falls in the past year? No No  Patient at Risk for Falls Due to - Other (Comment)   Functional Status Survey:    Vitals:   07/13/21 4081  BP: 126/84  Pulse: 95  Resp: 18  Temp: 97.6 F (36.4 C)  SpO2: 93%  Weight: 188 lb 14.4 oz (85.7 kg)  Height: 6\' 4"  (1.93 m)   Body mass index is 22.99 kg/m. Physical Exam Vitals reviewed.  Constitutional:      General: He is not in acute distress. HENT:     Head: Normocephalic.     Right Ear: There is no impacted cerumen.     Left Ear: There is no impacted cerumen.     Nose: Nose normal.     Mouth/Throat:     Pharynx: No posterior oropharyngeal erythema.  Eyes:     General:        Right eye: No discharge.        Left eye: No discharge.  Neck:     Vascular: No carotid bruit.  Cardiovascular:     Rate and Rhythm: Normal rate and regular rhythm.     Pulses: Normal pulses.     Heart sounds: Normal heart sounds. No murmur heard. Pulmonary:     Effort: Pulmonary effort is normal. No respiratory distress.     Breath sounds: Normal breath sounds. No wheezing.  Abdominal:     General:  Bowel sounds are normal. There is no distension.     Palpations: Abdomen is soft.     Tenderness: There is no abdominal tenderness.  Musculoskeletal:     Cervical back: Normal range of motion.     Right lower leg: No edema.     Left lower leg: No edema.  Lymphadenopathy:     Cervical: No cervical adenopathy.  Skin:    General: Skin is warm and dry.     Capillary Refill: Capillary refill takes less than 2 seconds.  Neurological:     General: No focal deficit present.     Mental Status: He is alert. Mental status is at baseline.     Motor: Weakness present.     Gait: Gait abnormal.     Comments: Wheelchair/hoyer  Psychiatric:        Attention and Perception: Attention normal.        Mood and Affect: Mood normal. Affect is flat.        Cognition and Memory: Memory is impaired.     Comments: Alert to self, follows commands, aphasia     Labs reviewed: Recent Labs    09/13/20 0000 01/27/21 0000 03/24/21 0000  NA 143 145 144  K 3.8 3.7 3.8  CL 108 106 106  CO2 29* 32* 34*  BUN 21 25* 24*  CREATININE 1.2 1.1 1.0  CALCIUM 9.4 9.5 9.2   Recent Labs    01/06/21 0000 01/24/21 0000 01/27/21 0000  AST 17 15 12*  ALT 21 16 13   ALKPHOS 51 57 55  ALBUMIN 3.8 3.7 3.9   Recent Labs    09/13/20 0000 10/28/20 0000 12/14/20 0000 01/24/21 0000 01/27/21 0000  WBC 6.5   < > 7.5 6.3 5.8  NEUTROABS 4,264.00  --   --   --  3,579.00  HGB 15.5   < > 15.5 14.9 14.4  HCT 46   < > 47 44 43  PLT 246   < > 319 279 267   < > = values in this interval not displayed.   Lab Results  Component Value Date   TSH 1.46 09/13/2020   No results found for: HGBA1C Lab Results  Component Value Date   CHOL 177 09/13/2020   HDL 45  09/13/2020   [place] 109 09/13/2020    Significant Diagnostic Results in last 30 days:  No results found.  Assessment/Plan: 1. Agitation due to dementia - increased behaviors of arm swinging and grabbing staff - will increase haldol 0.5 mg to TID  2. Late  onset Alzheimer's disease with behavioral disturbance ([place]) - increased agitation with ADLs - cont skilled nursing care - cont Depakote  3. Primary hypertension - controlled - cont HCTZ and losartan  4. Pure hypercholesterolemia - cont statin  5. Chronic low back pain, unspecified back pain laterality, unspecified whether sciatica present - cont Butrans weekly patches, lidocaine patches, meloxicam and scheduled tylenol  6. Benign prostatic hyperplasia, unspecified whether lower urinary tract symptoms present - cont Flomax  7. Idiopathic peripheral neuropathy - unsuccessful trial of gabapentin- increased behaviors - cont Lyrica  8. Depression, recurrent ([place]) - cont Cymbalta  9. Gastroesophageal reflux disease without esophagitis - cont Prilosec  10. Weight loss - January weight not recorded - cont monthly weight    Family/ staff Communication: plan discussed with wife and nurse  Labs/tests ordered:  none

## 2021-07-19 ENCOUNTER — Encounter: Payer: Self-pay | Admitting: Orthopedic Surgery

## 2021-08-10 ENCOUNTER — Non-Acute Institutional Stay (SKILLED_NURSING_FACILITY): Payer: Medicare Other | Admitting: Orthopedic Surgery

## 2021-08-10 ENCOUNTER — Encounter: Payer: Self-pay | Admitting: Orthopedic Surgery

## 2021-08-10 DIAGNOSIS — R634 Abnormal weight loss: Secondary | ICD-10-CM

## 2021-08-10 DIAGNOSIS — I10 Essential (primary) hypertension: Secondary | ICD-10-CM

## 2021-08-10 DIAGNOSIS — E7800 Pure hypercholesterolemia, unspecified: Secondary | ICD-10-CM

## 2021-08-10 DIAGNOSIS — G301 Alzheimer's disease with late onset: Secondary | ICD-10-CM | POA: Diagnosis not present

## 2021-08-10 DIAGNOSIS — G609 Hereditary and idiopathic neuropathy, unspecified: Secondary | ICD-10-CM

## 2021-08-10 DIAGNOSIS — N400 Enlarged prostate without lower urinary tract symptoms: Secondary | ICD-10-CM

## 2021-08-10 DIAGNOSIS — G8929 Other chronic pain: Secondary | ICD-10-CM

## 2021-08-10 DIAGNOSIS — K219 Gastro-esophageal reflux disease without esophagitis: Secondary | ICD-10-CM

## 2021-08-10 DIAGNOSIS — F339 Major depressive disorder, recurrent, unspecified: Secondary | ICD-10-CM

## 2021-08-10 DIAGNOSIS — Z66 Do not resuscitate: Secondary | ICD-10-CM | POA: Diagnosis not present

## 2021-08-10 DIAGNOSIS — F02818 Dementia in other diseases classified elsewhere, unspecified severity, with other behavioral disturbance: Secondary | ICD-10-CM

## 2021-08-10 DIAGNOSIS — M545 Low back pain, unspecified: Secondary | ICD-10-CM

## 2021-08-10 NOTE — Progress Notes (Signed)
Location:  [facility] Room Number: 8 Place of Service:  SNF [phone]) Provider:  Windell Moulding, AGNP-C  Virgie Dad, MD  Patient Care Team: Virgie Dad, MD as PCP - General (Internal Medicine)  Extended Emergency Contact Information Primary Emergency Contact: Bowne,Donna Address: [address]          [place], [place] [address] Johnnette Litter of [place] Phone: [phone] Mobile Phone: [phone] Relation: Spouse  Code Status:  DNR Goals of care: Advanced Directive information Advanced Directives 08/10/2021  Does Patient Have a Medical Advance Directive? Yes  Type of Paramedic of [place];Living will;Out of facility DNR (pink MOST or yellow form)  Does patient want to make changes to medical advance directive? No - Patient declined  Copy of [place] in Chart? Yes - validated most recent copy scanned in chart (See row information)  Would patient like information on creating a medical advance directive? -  Pre-existing out of facility DNR order (yellow form or pink MOST form) Pink MOST form placed in chart (order not valid for inpatient use);Yellow form placed in chart (order not valid for inpatient use)     Chief Complaint  Patient presents with   Medical Management of Chronic Issues    Routine visit. Discuss the need for Shingrix, T-DAP,PSV, colonoscopy, and Hepatitis C screening. Or postpone if patient refuses.     HPI:  Pt is a 66 y.o. male seen today for medical management of chronic diseases.    He continues to reside on the skilled nursing unit at [facility] due to Alzheimer's dementia. Past medical history includes: HTN, peripheral neuropathy, BPH, chronic back pain, depression HLD, seasonal allergies, and hyperbilirubinemia.  Alzheimer's- followed by hospice, bed bound- now requiring 2 person assist to turn in bed, does not speak today only moans, nursing staff reports he is less  agitated/combative during ADLs, remains on Depakote and haldol Weight loss- nursing assisting with meals, February weight has not been posted, appears to have lost more weight, see weights below HTN- BUN/creat 24/1.0 03/24/2021, remains on HCTZ and losartan HLD- LDL 109 09/2020, remains on pravastatin Chronic back pain- remains on Butrans weekly patches, lidocaine patches, meloxicam and scheduled tylenol BPH- remains on Flomax Peripheral neuropathy- unsuccessful trial of gabapentin, he was restarted back on Lyrica Depression- no changes on mood,remains on Cymbalta GERD- hgb 14.4 01/2021, remains on Prilosec  Fall 01/11- found sitting on blue mat, no apparent injury.   Recent blood pressures:  01/31- 123/83  01/24- 132/75  01/17- 114/74  Recent weights:  01/06- 171 lbs  12/01- 188.9 lbs  11/07- 181.9 lbs  10/10- 181.5 lbs  Past Medical History:  Diagnosis Date   Hyperlipidemia    Hypertension    Seasonal allergies    Past Surgical History:  Procedure Laterality Date   HAMMERTOE RECONSTRUCTION WITH WEIL OSTEOTOMY  08/01/2012   Procedure: HAMMERTOE RECONSTRUCTION WITH WEIL OSTEOTOMY;  Surgeon: Wylene Simmer, MD;  Location: [facility];  Service: Orthopedics;  Laterality: Right;  RIGHT SCARF OSTEOTOMY, MODIFIED MCBRIDE BUNIONECTOMY, , RIGHT 2ND - 4TH WEIL, RIGHT 2ND - 5TH DORSAL CAPSULOTOMIES, RIGHT 2ND - 4TH PIP ARTHRODESIS   LIPOMA EXCISION  08/01/2012   Procedure: EXCISION LIPOMA;  Surgeon: Rolm Bookbinder, MD;  Location: [facility];  Service: General;  Laterality: N/A;  EXCISION LIPOMAS X 6 (LEFT TRICEP, RIGHT TRICEP, DELTOID, BIL ABDOMINAL WALL AND LEFT THIGH)   NASAL SINUS SURGERY     SHOULDER  SURGERY     right    No Known Allergies  Outpatient Encounter Medications as of 08/10/2021  Medication Sig   acetaminophen (TYLENOL) 500 MG tablet Take 1,000 mg by mouth 3 (three) times daily.   buprenorphine (BUTRANS) 10 MCG/HR PTWK Place 1 patch onto the  skin once a week. Wednesday   cetirizine (ZYRTEC) 10 MG tablet Take 10 mg by mouth daily.   divalproex (DEPAKOTE) 125 MG DR tablet Take 125 mg by mouth 2 (two) times daily.   divalproex (DEPAKOTE) 250 MG DR tablet Take 250 mg by mouth daily. Between 7:30 to 11 pm, See other listing.   DULoxetine (CYMBALTA) 60 MG capsule Take 60 mg by mouth 2 (two) times daily.   haloperidol (HALDOL) 0.5 MG tablet Take 0.5 mg by mouth 3 (three) times daily.   hydrochlorothiazide (HYDRODIURIL) 25 MG tablet Take 25 mg by mouth daily.   Lidocaine 4 % PTCH Apply 1 patch topically daily as needed.   losartan (COZAAR) 100 MG tablet Take 100 mg by mouth daily.   meloxicam (MOBIC) 7.5 MG tablet Take 7.5 mg by mouth daily.   omeprazole (PRILOSEC) 20 MG capsule Take 20 mg by mouth daily.   pravastatin (PRAVACHOL) 40 MG tablet Take 40 mg by mouth daily.   pregabalin (LYRICA) 100 MG capsule Take 100 mg by mouth 2 (two) times daily.   tamsulosin (FLOMAX) 0.4 MG CAPS capsule Take 0.4 mg by mouth daily.   zinc oxide 20 % ointment Apply 1 application topically as needed for irritation.   No facility-administered encounter medications on file as of 08/10/2021.    Review of Systems  Unable to perform ROS: Dementia   Immunization History  Administered Date(s) Administered   Influenza Split 06/09/2015, 04/04/2016, 02/27/2017   Influenza,inj,Quad PF,6-35 Mos 04/03/2019, 04/06/2020   Influenza-Unspecified 05/04/2021   PFIZER Comirnaty(Gray Top)Covid-19 Tri-Sucrose Vaccine 12/15/2020   PFIZER(Purple Top)SARS-COV-2 Vaccination 08/25/2019, 09/16/2019, 05/03/2020   Pfizer Covid-19 Vaccine Bivalent Booster 49yrs & up 03/30/2021   Tdap 08/01/2010   Zoster, Live 08/24/2016   Pertinent  Health Maintenance Due  Topic Date Due   COLONOSCOPY (Pts 45-52yrs Insurance coverage will need to be confirmed)  Never done   INFLUENZA VACCINE  Completed   Fall Risk 03/01/2015 03/12/2015  Falls in the past year? No No  Patient at Risk for  Falls Due to - Other (Comment)   Functional Status Survey:    Vitals:   08/10/21 1003  BP: 123/83  Pulse: 81  Resp: 19  Temp: (!) 97.2 F (36.2 C)  SpO2: 95%  Weight: 171 lb (77.6 kg)  Height: 6\' 4"  (1.93 m)   Body mass index is 20.81 kg/m. Physical Exam Vitals reviewed.  Constitutional:      General: He is not in acute distress. HENT:     Head: Normocephalic.     Ears:     Comments: Unable to examine    Nose: Nose normal.     Mouth/Throat:     Mouth: Mucous membranes are moist.  Eyes:     General:        Right eye: No discharge.        Left eye: No discharge.  Neck:     Vascular: No carotid bruit.  Cardiovascular:     Rate and Rhythm: Normal rate and regular rhythm.     Pulses: Normal pulses.     Heart sounds: Normal heart sounds.  Pulmonary:     Effort: Pulmonary effort is normal.  Breath sounds: Normal breath sounds.  Abdominal:     General: Bowel sounds are normal. There is no distension.     Palpations: Abdomen is soft.     Tenderness: There is no abdominal tenderness.  Musculoskeletal:     Cervical back: Neck supple.     Right lower leg: No edema.     Left lower leg: No edema.  Lymphadenopathy:     Cervical: No cervical adenopathy.  Skin:    General: Skin is warm and dry.     Capillary Refill: Capillary refill takes less than 2 seconds.  Neurological:     General: No focal deficit present.     Mental Status: He is alert. Mental status is at baseline.     Motor: Weakness present.     Gait: Gait abnormal.     Comments: Bed bound, extremities rigid  Psychiatric:        Mood and Affect: Mood normal.        Behavior: Behavior normal.     Comments: Aphasia, does not follow commands    Labs reviewed: Recent Labs    09/13/20 0000 01/27/21 0000 03/24/21 0000  NA 143 145 144  K 3.8 3.7 3.8  CL 108 106 106  CO2 29* 32* 34*  BUN 21 25* 24*  CREATININE 1.2 1.1 1.0  CALCIUM 9.4 9.5 9.2   Recent Labs    01/06/21 0000 01/24/21 0000  01/27/21 0000  AST 17 15 12*  ALT 21 16 13   ALKPHOS 51 57 55  ALBUMIN 3.8 3.7 3.9   Recent Labs    09/13/20 0000 10/28/20 0000 12/14/20 0000 01/24/21 0000 01/27/21 0000  WBC 6.5   < > 7.5 6.3 5.8  NEUTROABS 4,264.00  --   --   --  3,579.00  HGB 15.5   < > 15.5 14.9 14.4  HCT 46   < > 47 44 43  PLT 246   < > 319 279 267   < > = values in this interval not displayed.   Lab Results  Component Value Date   TSH 1.46 09/13/2020   No results found for: HGBA1C Lab Results  Component Value Date   CHOL 177 09/13/2020   HDL 45 09/13/2020   LDLCALC 109 09/13/2020    Significant Diagnostic Results in last 30 days:  No results found.  Assessment/Plan 1. Do not resuscitate  2. Late onset Alzheimer's disease with behavioral disturbance ([place]) - followed by hospice - less agitation/combativeness with ADLs - bed bound- 2 person assist to turn - more rigid - aphasia appears to be worse - cont Depakote and Haldol - cont skilled nursing care  3. Weight loss - February weight not posted- suspect additional weight loss - down 17 lbs from Dec- Jan - cont meal assistance  4. Primary hypertension - controlled - cont HCTZ and losartan  5. Pure hypercholesterolemia - cont statin  6. Chronic low back pain, unspecified back pain laterality, unspecified whether sciatica present - stable - cont Butrans patches, lidocaine patches, meloxicam and scheduled tylenol  7. Benign prostatic hyperplasia, unspecified whether lower urinary tract symptoms present - cont Flomax  8. Idiopathic peripheral neuropathy - unsuccessful trial of gabapentin - cont Lyrica  9. Depression, recurrent ([place]) - no mood changes - cont Cymbalta  10. Gastroesophageal reflux disease without esophagitis - cont Prilosec     Family/ staff Communication: plan discussed with nurse  Labs/tests ordered:  none

## 2021-08-10 NOTE — Progress Notes (Deleted)
Location:      Place of Service:    Provider: Yvonna Alanis, NP  Patient Care Team: Virgie Dad, MD as PCP - General (Internal Medicine)  Extended Emergency Contact Information Primary Emergency Contact: Loor,Donna Address: [address]          [place], [place] [address] Johnnette Litter of [place] Phone: [phone] Mobile Phone: [phone] Relation: Spouse  Code Status:  DNR Goals of care: Advanced Directive information Advanced Directives 08/10/2021  Does Patient Have a Medical Advance Directive? Yes  Type of Paramedic of [place];Living will;Out of facility DNR (pink MOST or yellow form)  Does patient want to make changes to medical advance directive? No - Patient declined  Copy of [place] in Chart? Yes - validated most recent copy scanned in chart (See row information)  Would patient like information on creating a medical advance directive? -  Pre-existing out of facility DNR order (yellow form or pink MOST form) Pink MOST form placed in chart (order not valid for inpatient use);Yellow form placed in chart (order not valid for inpatient use)     Chief Complaint  Patient presents with   Medical Management of Chronic Issues    Routine visit. Discuss the need for Shingrix, T-DAP,PSV, colonoscopy, and Hepatitis C screening. Or postpone if patient refuses.     HPI:  Pt is a 66 y.o. male seen today for medical management of chronic diseases.     Past Medical History:  Diagnosis Date   Hyperlipidemia    Hypertension    Seasonal allergies    Past Surgical History:  Procedure Laterality Date   HAMMERTOE RECONSTRUCTION WITH WEIL OSTEOTOMY  08/01/2012   Procedure: HAMMERTOE RECONSTRUCTION WITH WEIL OSTEOTOMY;  Surgeon: Wylene Simmer, MD;  Location: [facility];  Service: Orthopedics;  Laterality: Right;  RIGHT SCARF OSTEOTOMY, MODIFIED MCBRIDE BUNIONECTOMY, , RIGHT 2ND - 4TH WEIL, RIGHT 2ND - 5TH DORSAL  CAPSULOTOMIES, RIGHT 2ND - 4TH PIP ARTHRODESIS   LIPOMA EXCISION  08/01/2012   Procedure: EXCISION LIPOMA;  Surgeon: Rolm Bookbinder, MD;  Location: [facility];  Service: General;  Laterality: N/A;  EXCISION LIPOMAS X 6 (LEFT TRICEP, RIGHT TRICEP, DELTOID, BIL ABDOMINAL WALL AND LEFT THIGH)   NASAL SINUS SURGERY     SHOULDER SURGERY     right    No Known Allergies  Outpatient Encounter Medications as of 08/10/2021  Medication Sig   acetaminophen (TYLENOL) 500 MG tablet Take 1,000 mg by mouth 3 (three) times daily.   buprenorphine (BUTRANS) 10 MCG/HR PTWK Place 1 patch onto the skin once a week. Wednesday   cetirizine (ZYRTEC) 10 MG tablet Take 10 mg by mouth daily.   divalproex (DEPAKOTE) 125 MG DR tablet Take 125 mg by mouth 2 (two) times daily.   divalproex (DEPAKOTE) 250 MG DR tablet Take 250 mg by mouth daily. Between 7:30 to 11 pm, See other listing.   DULoxetine (CYMBALTA) 60 MG capsule Take 60 mg by mouth 2 (two) times daily.   haloperidol (HALDOL) 0.5 MG tablet Take 0.5 mg by mouth 3 (three) times daily.   hydrochlorothiazide (HYDRODIURIL) 25 MG tablet Take 25 mg by mouth daily.   Lidocaine 4 % PTCH Apply 1 patch topically daily as needed.   losartan (COZAAR) 100 MG tablet Take 100 mg by mouth daily.   meloxicam (MOBIC) 7.5 MG tablet Take 7.5 mg by mouth daily.   omeprazole (PRILOSEC) 20 MG capsule Take 20 mg by mouth daily.  pravastatin (PRAVACHOL) 40 MG tablet Take 40 mg by mouth daily.   pregabalin (LYRICA) 100 MG capsule Take 100 mg by mouth 2 (two) times daily.   tamsulosin (FLOMAX) 0.4 MG CAPS capsule Take 0.4 mg by mouth daily.   zinc oxide 20 % ointment Apply 1 application topically as needed for irritation.   No facility-administered encounter medications on file as of 08/10/2021.    Review of Systems  Immunization History  Administered Date(s) Administered   Influenza Split 06/09/2015, 04/04/2016, 02/27/2017   Influenza,inj,Quad PF,6-35 Mos  04/03/2019, 04/06/2020   Influenza-Unspecified 05/04/2021   PFIZER Comirnaty(Gray Top)Covid-19 Tri-Sucrose Vaccine 12/15/2020   PFIZER(Purple Top)SARS-COV-2 Vaccination 08/25/2019, 09/16/2019, 05/03/2020   Pfizer Covid-19 Vaccine Bivalent Booster 52yrs & up 03/30/2021   Tdap 08/01/2010   Zoster, Live 08/24/2016   Pertinent  Health Maintenance Due  Topic Date Due   COLONOSCOPY (Pts 45-76yrs Insurance coverage will need to be confirmed)  Never done   INFLUENZA VACCINE  Completed   Fall Risk 03/01/2015 03/12/2015  Falls in the past year? No No  Patient at Risk for Falls Due to - Other (Comment)   Functional Status Survey:    Vitals:   08/10/21 1003  BP: 123/83  Pulse: 81  Resp: 19  Temp: (!) 97.2 F (36.2 C)  SpO2: 95%  Weight: 171 lb (77.6 kg)  Height: 6\' 4"  (1.93 m)   Body mass index is 20.81 kg/m. Physical Exam  Labs reviewed: Recent Labs    09/13/20 0000 01/27/21 0000 03/24/21 0000  NA 143 145 144  K 3.8 3.7 3.8  CL 108 106 106  CO2 29* 32* 34*  BUN 21 25* 24*  CREATININE 1.2 1.1 1.0  CALCIUM 9.4 9.5 9.2   Recent Labs    01/06/21 0000 01/24/21 0000 01/27/21 0000  AST 17 15 12*  ALT 21 16 13   ALKPHOS 51 57 55  ALBUMIN 3.8 3.7 3.9   Recent Labs    09/13/20 0000 10/28/20 0000 12/14/20 0000 01/24/21 0000 01/27/21 0000  WBC 6.5   < > 7.5 6.3 5.8  NEUTROABS 4,264.00  --   --   --  3,579.00  HGB 15.5   < > 15.5 14.9 14.4  HCT 46   < > 47 44 43  PLT 246   < > 319 279 267   < > = values in this interval not displayed.   Lab Results  Component Value Date   TSH 1.46 09/13/2020   No results found for: HGBA1C Lab Results  Component Value Date   CHOL 177 09/13/2020   HDL 45 09/13/2020   LDLCALC 109 09/13/2020    Significant Diagnostic Results in last 30 days:  No results found.  Assessment/Plan 1. Do not resuscitate ***    Family/ staff Communication: ***  Labs/tests ordered:  ***

## 2021-08-23 ENCOUNTER — Encounter: Payer: Self-pay | Admitting: Nurse Practitioner

## 2021-08-23 ENCOUNTER — Non-Acute Institutional Stay (SKILLED_NURSING_FACILITY): Payer: Medicare Other | Admitting: Nurse Practitioner

## 2021-08-23 DIAGNOSIS — F02818 Dementia in other diseases classified elsewhere, unspecified severity, with other behavioral disturbance: Secondary | ICD-10-CM

## 2021-08-23 DIAGNOSIS — F339 Major depressive disorder, recurrent, unspecified: Secondary | ICD-10-CM

## 2021-08-23 DIAGNOSIS — K219 Gastro-esophageal reflux disease without esophagitis: Secondary | ICD-10-CM

## 2021-08-23 DIAGNOSIS — M545 Low back pain, unspecified: Secondary | ICD-10-CM | POA: Diagnosis not present

## 2021-08-23 DIAGNOSIS — G609 Hereditary and idiopathic neuropathy, unspecified: Secondary | ICD-10-CM

## 2021-08-23 DIAGNOSIS — R627 Adult failure to thrive: Secondary | ICD-10-CM

## 2021-08-23 DIAGNOSIS — G8929 Other chronic pain: Secondary | ICD-10-CM

## 2021-08-23 DIAGNOSIS — I10 Essential (primary) hypertension: Secondary | ICD-10-CM

## 2021-08-23 DIAGNOSIS — G300 Alzheimer's disease with early onset: Secondary | ICD-10-CM

## 2021-08-23 DIAGNOSIS — E7800 Pure hypercholesterolemia, unspecified: Secondary | ICD-10-CM

## 2021-08-23 DIAGNOSIS — N400 Enlarged prostate without lower urinary tract symptoms: Secondary | ICD-10-CM

## 2021-08-23 DIAGNOSIS — L89312 Pressure ulcer of right buttock, stage 2: Secondary | ICD-10-CM

## 2021-08-23 NOTE — Assessment & Plan Note (Signed)
FTT/Weight loss, supportive care, Hospice service, #17Ibs weight loss in the past month, pressure ulcer @ R buttock present.

## 2021-08-23 NOTE — Assessment & Plan Note (Signed)
Stable,  takes Omeprazole 

## 2021-08-23 NOTE — Progress Notes (Signed)
Location:   SNF [place] Room Number: 8 Place of Service:  SNF (31) Provider: [facility] Tenna Lacko NP  Virgie Dad, MD  Patient Care Team: Virgie Dad, MD as PCP - General (Internal Medicine)  Extended Emergency Contact Information Primary Emergency Contact: Durley,Donna Address: [address]          [place], [place] [address] Johnnette Litter of [place] Phone: [phone] Mobile Phone: [phone] Relation: Spouse  Code Status:  DNR Goals of care: Advanced Directive information Advanced Directives 08/10/2021  Does Patient Have a Medical Advance Directive? Yes  Type of Paramedic of [place];Living will;Out of facility DNR (pink MOST or yellow form)  Does patient want to make changes to medical advance directive? No - Patient declined  Copy of [place] in Chart? Yes - validated most recent copy scanned in chart (See row information)  Would patient like information on creating a medical advance directive? -  Pre-existing out of facility DNR order (yellow form or pink MOST form) Pink MOST form placed in chart (order not valid for inpatient use);Yellow form placed in chart (order not valid for inpatient use)     Chief Complaint  Patient presents with   Acute Visit    pressure ulcer R buttock    HPI:  Pt is a 66 y.o. male seen today for an acute visit for pressure ulcer noted superior right upper buttock, no s/s of infection, the patient needs assistance for repositioning.     HTN, takes Losartan, HCTZ, Bun/creat 24/1.0 03/24/21             Chronic lower back pain without sciatica, takes Pregabalin, Meloxicam,  Buprenorphine, Lidocaine patch, Tylenol.             Peripheral neuropathy, takes Pregabalin             Hyperlipidemia, takes Pravastatin, LDL 109 09/13/20             Depression, takes Cymbalta, Haldol, Lorazepam prn, Depakote, TSH 1.46 09/13/20             BPH takes Tamsulosin             Alzheimer's dementia,  takes Memantine, Donepezil, sundowning behavioral issues, agitation. MRI showed severe atrophy and small vessel ischemia, f/u [facility].             FTT/Weight loss, supportive care, Hospice service, #17Ibs weight loss in the past month.              GERD, takes Omeprazole.              Seasonal allergies, takes Zyrtec  Past Medical History:  Diagnosis Date   Hyperlipidemia    Hypertension    Seasonal allergies    Past Surgical History:  Procedure Laterality Date   HAMMERTOE RECONSTRUCTION WITH WEIL OSTEOTOMY  08/01/2012   Procedure: HAMMERTOE RECONSTRUCTION WITH WEIL OSTEOTOMY;  Surgeon: Wylene Simmer, MD;  Location: [place];  Service: Orthopedics;  Laterality: Right;  RIGHT SCARF OSTEOTOMY, MODIFIED MCBRIDE BUNIONECTOMY, , RIGHT 2ND - 4TH WEIL, RIGHT 2ND - 5TH DORSAL CAPSULOTOMIES, RIGHT 2ND - 4TH PIP ARTHRODESIS   LIPOMA EXCISION  08/01/2012   Procedure: EXCISION LIPOMA;  Surgeon: Rolm Bookbinder, MD;  Location: [place];  Service: General;  Laterality: N/A;  EXCISION LIPOMAS X 6 (LEFT TRICEP, RIGHT TRICEP, DELTOID, BIL ABDOMINAL WALL AND LEFT THIGH)   NASAL SINUS SURGERY     SHOULDER SURGERY  right    No Known Allergies  Allergies as of 08/23/2021   No Known Allergies      Medication List        Accurate as of August 23, 2021 11:59 PM. If you have any questions, ask your nurse or doctor.          acetaminophen 500 MG tablet Commonly known as: TYLENOL Take 1,000 mg by mouth 3 (three) times daily.   buprenorphine 10 MCG/HR Ptwk Commonly known as: BUTRANS Place 1 patch onto the skin once a week. Wednesday   cetirizine 10 MG tablet Commonly known as: ZYRTEC Take 10 mg by mouth daily.   divalproex 125 MG DR tablet Commonly known as: DEPAKOTE Take 125 mg by mouth 2 (two) times daily.   divalproex 250 MG DR tablet Commonly known as: DEPAKOTE Take 250 mg by mouth daily. Between 7:30 to 11 pm, See other listing.   DULoxetine  60 MG capsule Commonly known as: CYMBALTA Take 60 mg by mouth 2 (two) times daily.   haloperidol 0.5 MG tablet Commonly known as: HALDOL Take 0.5 mg by mouth 3 (three) times daily.   hydrochlorothiazide 25 MG tablet Commonly known as: HYDRODIURIL Take 25 mg by mouth daily.   Lidocaine 4 % Ptch Apply 1 patch topically daily as needed.   losartan 100 MG tablet Commonly known as: COZAAR Take 100 mg by mouth daily.   meloxicam 7.5 MG tablet Commonly known as: MOBIC Take 7.5 mg by mouth daily.   omeprazole 20 MG capsule Commonly known as: PRILOSEC Take 20 mg by mouth daily.   pravastatin 40 MG tablet Commonly known as: PRAVACHOL Take 40 mg by mouth daily.   pregabalin 100 MG capsule Commonly known as: LYRICA Take 100 mg by mouth 2 (two) times daily.   tamsulosin 0.4 MG Caps capsule Commonly known as: FLOMAX Take 0.4 mg by mouth daily.   zinc oxide 20 % ointment Apply 1 application topically as needed for irritation.        Review of Systems  Unable to perform ROS: Dementia   Immunization History  Administered Date(s) Administered   Influenza Split 06/09/2015, 04/04/2016, 02/27/2017   Influenza,inj,Quad PF,6-35 Mos 04/03/2019, 04/06/2020   Influenza-Unspecified 05/04/2021   PFIZER Comirnaty(Gray Top)Covid-19 Tri-Sucrose Vaccine 12/15/2020   PFIZER(Purple Top)SARS-COV-2 Vaccination 08/25/2019, 09/16/2019, 05/03/2020   Pfizer Covid-19 Vaccine Bivalent Booster 41yrs & up 03/30/2021   Tdap 08/01/2010   Zoster, Live 08/24/2016   Pertinent  Health Maintenance Due  Topic Date Due   COLONOSCOPY (Pts 45-48yrs Insurance coverage will need to be confirmed)  Never done   INFLUENZA VACCINE  Completed   Fall Risk 03/01/2015 03/12/2015  Falls in the past year? No No  Patient at Risk for Falls Due to - Other (Comment)   Functional Status Survey:    Vitals:   08/23/21 1557  BP: 115/74  Pulse: 75  Resp: 18  Temp: (!) 97 F (36.1 C)  SpO2: 94%  Weight: 165 lb (74.8  kg)  Height: 6\' 4"  (1.93 m)   Body mass index is 20.08 kg/m. Physical Exam Vitals and nursing note reviewed.  Constitutional:      Comments: Weight loss #17Ibs in the past month.   HENT:     Head: Normocephalic and atraumatic.     Mouth/Throat:     Mouth: Mucous membranes are moist.  Eyes:     Extraocular Movements: Extraocular movements intact.     Conjunctiva/sclera: Conjunctivae normal.     Pupils: Pupils are equal,  round, and reactive to light.  Cardiovascular:     Rate and Rhythm: Normal rate and regular rhythm.     Heart sounds: No murmur heard. Pulmonary:     Effort: Pulmonary effort is normal.     Breath sounds: No rales.  Abdominal:     General: Bowel sounds are normal.     Palpations: Abdomen is soft.     Tenderness: There is no abdominal tenderness.  Musculoskeletal:        General: Normal range of motion.     Cervical back: Normal range of motion and neck supple.     Right lower leg: No edema.     Left lower leg: No edema.  Skin:    General: Skin is warm and dry.     Comments: Superior right buttock a quarter sized superficial skin missing area, no s/s of infection.   Neurological:     General: No focal deficit present.     Mental Status: He is alert. Mental status is at baseline.     Motor: No weakness.     Coordination: Coordination normal.     Gait: Gait abnormal.     Comments: Oriented to person  Psychiatric:     Comments: Followed simple directions during my examination.     Labs reviewed: Recent Labs    09/13/20 0000 01/27/21 0000 03/24/21 0000  NA 143 145 144  K 3.8 3.7 3.8  CL 108 106 106  CO2 29* 32* 34*  BUN 21 25* 24*  CREATININE 1.2 1.1 1.0  CALCIUM 9.4 9.5 9.2   Recent Labs    01/06/21 0000 01/24/21 0000 01/27/21 0000  AST 17 15 12*  ALT 21 16 13   ALKPHOS 51 57 55  ALBUMIN 3.8 3.7 3.9   Recent Labs    09/13/20 0000 10/28/20 0000 12/14/20 0000 01/24/21 0000 01/27/21 0000  WBC 6.5   < > 7.5 6.3 5.8  NEUTROABS  4,264.00  --   --   --  3,579.00  HGB 15.5   < > 15.5 14.9 14.4  HCT 46   < > 47 44 43  PLT 246   < > 319 279 267   < > = values in this interval not displayed.   Lab Results  Component Value Date   TSH 1.46 09/13/2020   No results found for: HGBA1C Lab Results  Component Value Date   CHOL 177 09/13/2020   HDL 45 09/13/2020   LDLCALC 109 09/13/2020    Significant Diagnostic Results in last 30 days:  No results found.  Assessment/Plan Pressure ulcer of right buttock, stage 2 (HCC) pressure ulcer noted superior right upper buttock, no s/s of infection, the patient needs assistance for repositioning. Apply Hydrocolloid dressing q 3 days and prn. Encourage/assist the patient with frequent repositioning for pressure reduction.   HTN (hypertension) Blood pressure is controlled,  takes Losartan, HCTZ, Bun/creat 24/1.0 03/24/21  Chronic lower back pain Chronic lower back pain without sciatica, takes Pregabalin, Meloxicam,  Buprenorphine, Lidocaine patch, Tylenol.  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 MRI showed severe atrophy and small vessel ischemia, f/u [facility].  Adult failure to thrive FTT/Weight loss, supportive care, Hospice service, #17Ibs weight loss in  the past month, pressure ulcer @ R buttock present.   GERD (gastroesophageal reflux disease) Stable, takes Omeprazole.     Family/ staff Communication: plan of care reviewed with the patient and charge nurse.   Labs/tests ordered:  none  Time spend 25 minutes.

## 2021-08-23 NOTE — Assessment & Plan Note (Signed)
Occasionally emotional/behaviroal outbursts,  takes Cymbalta, Haldol, Lorazepam prn, Depakote, TSH 1.46 09/13/20

## 2021-08-23 NOTE — Assessment & Plan Note (Signed)
takes Pravastatin, LDL 109 09/13/20

## 2021-08-23 NOTE — Assessment & Plan Note (Signed)
No urinary retention,  takes Tamsulosin 

## 2021-08-23 NOTE — Assessment & Plan Note (Signed)
Blood pressure is controlled,  takes Losartan, HCTZ, Bun/creat 24/1.0 03/24/21

## 2021-08-23 NOTE — Assessment & Plan Note (Signed)
takes Memantine, Donepezil, sundowning behavioral issues, agitation. MRI showed severe atrophy and small vessel ischemia, f/u [facility].

## 2021-08-23 NOTE — Assessment & Plan Note (Signed)
Chronic lower back pain without sciatica, takes Pregabalin, Meloxicam,  Buprenorphine, Lidocaine patch, Tylenol.

## 2021-08-23 NOTE — Assessment & Plan Note (Signed)
takes Pregabalin

## 2021-08-23 NOTE — Assessment & Plan Note (Signed)
pressure ulcer noted superior right upper buttock, no s/s of infection, the patient needs assistance for repositioning. Apply Hydrocolloid dressing q 3 days and prn. Encourage/assist the patient with frequent repositioning for pressure reduction.

## 2021-08-25 ENCOUNTER — Encounter: Payer: Self-pay | Admitting: Nurse Practitioner

## 2021-08-30 ENCOUNTER — Encounter: Payer: Self-pay | Admitting: Internal Medicine

## 2021-08-30 NOTE — Telephone Encounter (Signed)
[facility] SNF   Forwarded to Dr. Lyndel Safe and [facility]

## 2021-09-01 ENCOUNTER — Encounter: Payer: Self-pay | Admitting: Internal Medicine

## 2021-09-01 ENCOUNTER — Non-Acute Institutional Stay (SKILLED_NURSING_FACILITY): Payer: Medicare Other | Admitting: Internal Medicine

## 2021-09-01 DIAGNOSIS — E7800 Pure hypercholesterolemia, unspecified: Secondary | ICD-10-CM | POA: Diagnosis not present

## 2021-09-01 DIAGNOSIS — M545 Low back pain, unspecified: Secondary | ICD-10-CM | POA: Diagnosis not present

## 2021-09-01 DIAGNOSIS — G609 Hereditary and idiopathic neuropathy, unspecified: Secondary | ICD-10-CM | POA: Diagnosis not present

## 2021-09-01 DIAGNOSIS — F339 Major depressive disorder, recurrent, unspecified: Secondary | ICD-10-CM

## 2021-09-01 DIAGNOSIS — N400 Enlarged prostate without lower urinary tract symptoms: Secondary | ICD-10-CM

## 2021-09-01 DIAGNOSIS — G8929 Other chronic pain: Secondary | ICD-10-CM

## 2021-09-01 DIAGNOSIS — G300 Alzheimer's disease with early onset: Secondary | ICD-10-CM | POA: Diagnosis not present

## 2021-09-01 DIAGNOSIS — F02818 Dementia in other diseases classified elsewhere, unspecified severity, with other behavioral disturbance: Secondary | ICD-10-CM

## 2021-09-01 DIAGNOSIS — L89312 Pressure ulcer of right buttock, stage 2: Secondary | ICD-10-CM

## 2021-09-01 DIAGNOSIS — I10 Essential (primary) hypertension: Secondary | ICD-10-CM

## 2021-09-01 NOTE — Progress Notes (Signed)
Location: [place] Room Number: 8 Place of Service:  SNF [phone])  Provider: Veleta Miners MD  Code Status: DNR [place] Goals of Care:  Advanced Directives 09/01/2021  Does Patient Have a Medical Advance Directive? Yes  Type of Paramedic of [place];Living will;Out of facility DNR (pink MOST or yellow form)  Does patient want to make changes to medical advance directive? No - Patient declined  Copy of [place] in Chart? Yes - validated most recent copy scanned in chart (See row information)  Would patient like information on creating a medical advance directive? -  Pre-existing out of facility DNR order (yellow form or pink MOST form) Pink MOST form placed in chart (order not valid for inpatient use);Yellow form placed in chart (order not valid for inpatient use)     Chief Complaint  Patient presents with   Acute Visit    Pain management    HPI: Patient is a 66 y.o. male seen today for an acute visit for Pain Management  Patient has a history of Alzheimer's dementia with behavioral disturbance.  Has [facility]. Was diagnosed at age of 22.  Had MRI done which showed severe atrophy and small vessel ischemia.  Has been on Aricept and Namenda. His wife who is the main caregiver has moved him here due to his underlying physical decline and cognitive decline Has history of chronic back pain MRI has shown lumbar stenosis lat L3-4, L4-5. Prior L2 compression fracture On high doses of Lyrica and started on Butrans per Neurology    Patient now is End stage Dementia Total ADLS care [facility] care Unable to feed himself. Meds need to be crushed and he sometimes does not open his mouth to take it Still gets Agitated sometimes when they are trying to force meds or change him Has lost 20 lbs in Past month Wt Readings from Last 3 Encounters:  09/01/21 165 lb (74.8 kg)  08/23/21 165 lb (74.8 kg)   08/10/21 171 lb (77.6 kg)     Past Medical History:  Diagnosis Date   Hyperlipidemia    Hypertension    Seasonal allergies     Past Surgical History:  Procedure Laterality Date   HAMMERTOE RECONSTRUCTION WITH WEIL OSTEOTOMY  08/01/2012   Procedure: HAMMERTOE RECONSTRUCTION WITH WEIL OSTEOTOMY;  Surgeon: Wylene Simmer, MD;  Location: [place];  Service: Orthopedics;  Laterality: Right;  RIGHT SCARF OSTEOTOMY, MODIFIED MCBRIDE BUNIONECTOMY, , RIGHT 2ND - 4TH WEIL, RIGHT 2ND - 5TH DORSAL CAPSULOTOMIES, RIGHT 2ND - 4TH PIP ARTHRODESIS   LIPOMA EXCISION  08/01/2012   Procedure: EXCISION LIPOMA;  Surgeon: Rolm Bookbinder, MD;  Location: [place];  Service: General;  Laterality: N/A;  EXCISION LIPOMAS X 6 (LEFT TRICEP, RIGHT TRICEP, DELTOID, BIL ABDOMINAL WALL AND LEFT THIGH)   NASAL SINUS SURGERY     SHOULDER SURGERY     right    No Known Allergies  Outpatient Encounter Medications as of 09/01/2021  Medication Sig   acetaminophen (TYLENOL) 500 MG tablet Take 1,000 mg by mouth 3 (three) times daily.   buprenorphine (BUTRANS) 10 MCG/HR PTWK Place 1 patch onto the skin once a week. Wednesday   cetirizine (ZYRTEC) 10 MG tablet Take 10 mg by mouth daily.   divalproex (DEPAKOTE) 125 MG DR tablet Take 125 mg by mouth 2 (two) times daily.   divalproex (DEPAKOTE) 250 MG DR tablet Take 250 mg by mouth daily.  Between 7:30 to 11 pm, See other listing.   DULoxetine (CYMBALTA) 60 MG capsule Take 60 mg by mouth 2 (two) times daily.   haloperidol (HALDOL) 0.5 MG tablet Take 0.5 mg by mouth 3 (three) times daily.   hydrochlorothiazide (HYDRODIURIL) 25 MG tablet Take 25 mg by mouth daily.   Lidocaine 4 % PTCH Apply 1 patch topically daily as needed.   losartan (COZAAR) 100 MG tablet Take 100 mg by mouth daily.   meloxicam (MOBIC) 7.5 MG tablet Take 7.5 mg by mouth daily.   omeprazole (PRILOSEC) 20 MG capsule Take 20 mg by mouth daily.   pravastatin (PRAVACHOL) 40 MG  tablet Take 40 mg by mouth daily.   pregabalin (LYRICA) 100 MG capsule Take 100 mg by mouth 2 (two) times daily.   senna (SENOKOT) 8.6 MG TABS tablet Take 1 tablet by mouth daily.   tamsulosin (FLOMAX) 0.4 MG CAPS capsule Take 0.4 mg by mouth daily.   zinc oxide 20 % ointment Apply 1 application topically as needed for irritation.   No facility-administered encounter medications on file as of 09/01/2021.    Review of Systems:  Review of Systems  Unable to perform ROS: Dementia   Health Maintenance  Topic Date Due   Pneumonia Vaccine 40+ Years old (1 - PCV) Never done   Hepatitis C Screening  Never done   COLONOSCOPY (Pts 45-15yrs Insurance coverage will need to be confirmed)  Never done   Zoster Vaccines- Shingrix (1 of 2) Never done   TETANUS/TDAP  08/01/2020   INFLUENZA VACCINE  Completed   COVID-19 Vaccine  Completed   HPV VACCINES  Aged Out    Physical Exam: Vitals:   09/01/21 1225  BP: 134/69  Pulse: 75  Resp: 20  Temp: (!) 97 F (36.1 C)  SpO2: 94%  Weight: 165 lb (74.8 kg)  Height: 6\' 4"  (1.93 m)   Body mass index is 20.08 kg/m. Physical Exam Vitals reviewed.  HENT:     Head: Normocephalic.     Mouth/Throat:     Mouth: Mucous membranes are moist.     Pharynx: Oropharynx is clear.  Eyes:     Pupils: Pupils are equal, round, and reactive to light.  Cardiovascular:     Rate and Rhythm: Normal rate and regular rhythm.     Pulses: Normal pulses.     Heart sounds: No murmur heard. Pulmonary:     Effort: Pulmonary effort is normal. No respiratory distress.     Breath sounds: Normal breath sounds. No rales.  Abdominal:     General: Abdomen is flat. Bowel sounds are normal.     Palpations: Abdomen is soft.  Musculoskeletal:        General: No swelling.     Cervical back: Neck supple.  Skin:    General: Skin is warm.     Comments: Stage 1 Pressure wound in his Bottom  Neurological:     Mental Status: He is alert.     Comments: Has aphasia Still replies  in one word and smiles. Does not follow commands  Psychiatric:        Mood and Affect: Mood normal.        Thought Content: Thought content normal.    Labs reviewed: Basic Metabolic Panel: Recent Labs    09/13/20 0000 01/27/21 0000 03/24/21 0000  NA 143 145 144  K 3.8 3.7 3.8  CL 108 106 106  CO2 29* 32* 34*  BUN 21 25* 24*  CREATININE 1.2  1.1 1.0  CALCIUM 9.4 9.5 9.2  TSH 1.46  --   --    Liver Function Tests: Recent Labs    01/06/21 0000 01/24/21 0000 01/27/21 0000  AST 17 15 12*  ALT 21 16 13   ALKPHOS 51 57 55  ALBUMIN 3.8 3.7 3.9   No results for input(s): LIPASE, AMYLASE in the last 8760 hours. No results for input(s): AMMONIA in the last 8760 hours. CBC: Recent Labs    09/13/20 0000 10/28/20 0000 12/14/20 0000 01/24/21 0000 01/27/21 0000  WBC 6.5   < > 7.5 6.3 5.8  NEUTROABS 4,264.00  --   --   --  3,579.00  HGB 15.5   < > 15.5 14.9 14.4  HCT 46   < > 47 44 43  PLT 246   < > 319 279 267   < > = values in this interval not displayed.   Lipid Panel: Recent Labs    09/13/20 0000  CHOL 177  HDL 45  LDLCALC 109   No results found for: HGBA1C  Procedures since last visit: No results found.  Assessment/Plan 1. 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 Chronic low back pain, unspecified back pain laterality, unspecified whether sciatica present Discontinue Butrans and Meloxicam Start on Roxanol 5 mg QID  3. Idiopathic peripheral neuropathy Taper Lyriica Will Use Roxanol now for Pain control  4. Pure hypercholesterolemia Discontinue statin due to goals of care  5. Depression, recurrent (HCC) Discontinue Cymbalta Schedule  Ativan 1 mg PO BID for Agitation Also on Depakote  6. Benign prostatic hyperplasia, unspecified  whether lower urinary tract symptoms present Continue Flomax  7. Primary hypertension Change HCTZ to 12.5 mg QD due to weight loss If his BP good  witll take him off it Continue Cozaar for now  8. Pressure ulcer of right buttock, stage 2 ([place]) Has healed well and now stage 1 Use foam dressing     Labs/tests ordered:  * No order type specified * Next appt:  Visit date not found

## 2021-09-02 NOTE — Addendum Note (Signed)
Addended by: Georgina Snell on: 09/02/2021 09:34 AM   Modules accepted: Orders

## 2021-09-12 ENCOUNTER — Non-Acute Institutional Stay (SKILLED_NURSING_FACILITY): Payer: Medicare Other | Admitting: Orthopedic Surgery

## 2021-09-12 ENCOUNTER — Encounter: Payer: Self-pay | Admitting: Orthopedic Surgery

## 2021-09-12 DIAGNOSIS — R634 Abnormal weight loss: Secondary | ICD-10-CM | POA: Diagnosis not present

## 2021-09-12 DIAGNOSIS — M545 Low back pain, unspecified: Secondary | ICD-10-CM | POA: Diagnosis not present

## 2021-09-12 DIAGNOSIS — F339 Major depressive disorder, recurrent, unspecified: Secondary | ICD-10-CM

## 2021-09-12 DIAGNOSIS — F02818 Dementia in other diseases classified elsewhere, unspecified severity, with other behavioral disturbance: Secondary | ICD-10-CM

## 2021-09-12 DIAGNOSIS — K219 Gastro-esophageal reflux disease without esophagitis: Secondary | ICD-10-CM

## 2021-09-12 DIAGNOSIS — I10 Essential (primary) hypertension: Secondary | ICD-10-CM | POA: Diagnosis not present

## 2021-09-12 DIAGNOSIS — G301 Alzheimer's disease with late onset: Secondary | ICD-10-CM | POA: Diagnosis not present

## 2021-09-12 DIAGNOSIS — E7800 Pure hypercholesterolemia, unspecified: Secondary | ICD-10-CM

## 2021-09-12 DIAGNOSIS — N400 Enlarged prostate without lower urinary tract symptoms: Secondary | ICD-10-CM

## 2021-09-12 DIAGNOSIS — G8929 Other chronic pain: Secondary | ICD-10-CM

## 2021-09-12 DIAGNOSIS — G609 Hereditary and idiopathic neuropathy, unspecified: Secondary | ICD-10-CM

## 2021-09-12 MED ORDER — MORPHINE SULFATE (CONCENTRATE) 20 MG/ML PO SOLN: 5.0000 mg | Freq: Every day | ORAL | 0 refills | Status: DC

## 2021-09-12 MED ORDER — MORPHINE SULFATE (CONCENTRATE) 20 MG/ML PO SOLN: 20.0000 mg | Freq: Every day | ORAL | 0 refills | Status: DC

## 2021-09-12 NOTE — Progress Notes (Signed)
Location:  [facility] Room Number: N08/A Place of Service:  SNF [phone]) Provider:  Yvonna Alanis, NP  Patient Care Team: Virgie Dad, MD as PCP - General (Internal Medicine)  Extended Emergency Contact Information Primary Emergency Contact: Mccuistion,Donna Address: [address]          [place], [place] [address] Johnnette Litter of [place] Phone: [phone] Mobile Phone: [phone] Relation: Spouse  Code Status:  DNR Goals of care: Advanced Directive information Advanced Directives 09/12/2021  Does Patient Have a Medical Advance Directive? Yes  Type of Paramedic of [place];Living will;Out of facility DNR (pink MOST or yellow form)  Does patient want to make changes to medical advance directive? No - Patient declined  Copy of [place] in Chart? Yes - validated most recent copy scanned in chart (See row information)  Would patient like information on creating a medical advance directive? -  Pre-existing out of facility DNR order (yellow form or pink MOST form) Pink MOST form placed in chart (order not valid for inpatient use);Yellow form placed in chart (order not valid for inpatient use)     Chief Complaint  Patient presents with   Medical Management of Chronic Issues    Routine visit. Discuss the need for Hep C screening, colonoscopy, Shingrix, TDAP, and pneumonia vaccine, or post pone if patient refuses.     HPI:  Pt is a 66 y.o. male seen today for medical management of chronic diseases.    He continues to reside on the skilled nursing unit at [facility] due to Alzheimer's dementia. Past medical history includes: HTN, peripheral neuropathy, BPH, chronic back pain, depression HLD, seasonal allergies, and hyperbilirubinemia.  Alzheimer's- followed by hospice, hoyer transfers, nonverbal, no behavioral outbursts, cannot feed himself- requires nursing assistance, remains on Depakote,Haldol and  Ativan Weight loss- see weights below, eating about 50-75% of most meals HTN- BUN/creat 24/1.0 03/24/2021, remains on HCTZ and losartan HLD- LDL 109 09/2020, statin discontinued Chronic back pain- increased pain with movement during ADLs per wife/nursing staff, remains on scheduled tylenol, Lyrica, and roxanol  BPH- remains on Flomax Peripheral neuropathy- unsuccessful trial of gabapentin, he was restarted back on Lyrica Depression- no changes on mood, Cymbalta discontinued GERD- hgb 14.4 01/2021, remains on Prilosec  No recent falls or injuries.   Recent blood pressures:  02/28- 114/82  02/21- 134/69  02/14- 100/68  Recent weights:  03/04- 154.5 lbs  02/09- 165 lbs  01/06- 171 lbs      Past Medical History:  Diagnosis Date   Hyperlipidemia    Hypertension    Seasonal allergies    Past Surgical History:  Procedure Laterality Date   HAMMERTOE RECONSTRUCTION WITH WEIL OSTEOTOMY  08/01/2012   Procedure: HAMMERTOE RECONSTRUCTION WITH WEIL OSTEOTOMY;  Surgeon: Wylene Simmer, MD;  Location: [facility];  Service: Orthopedics;  Laterality: Right;  RIGHT SCARF OSTEOTOMY, MODIFIED MCBRIDE BUNIONECTOMY, , RIGHT 2ND - 4TH WEIL, RIGHT 2ND - 5TH DORSAL CAPSULOTOMIES, RIGHT 2ND - 4TH PIP ARTHRODESIS   LIPOMA EXCISION  08/01/2012   Procedure: EXCISION LIPOMA;  Surgeon: Rolm Bookbinder, MD;  Location: [facility];  Service: General;  Laterality: N/A;  EXCISION LIPOMAS X 6 (LEFT TRICEP, RIGHT TRICEP, DELTOID, BIL ABDOMINAL WALL AND LEFT THIGH)   NASAL SINUS SURGERY     SHOULDER SURGERY     right    No Known Allergies  Outpatient Encounter Medications as of 09/12/2021  Medication Sig   acetaminophen (  TYLENOL) 500 MG tablet Take 1,000 mg by mouth 3 (three) times daily.   cetirizine (ZYRTEC) 10 MG tablet Take 10 mg by mouth daily.   divalproex (DEPAKOTE) 125 MG DR tablet Take 125 mg by mouth 2 (two) times daily.   divalproex (DEPAKOTE) 250 MG DR tablet Take 250 mg  by mouth daily. Between 7:30 to 11 pm, See other listing.   haloperidol (HALDOL) 0.5 MG tablet Take 0.5 mg by mouth 3 (three) times daily.   hydrochlorothiazide (HYDRODIURIL) 25 MG tablet Take 12.5 mg by mouth daily.   LORazepam (ATIVAN) 1 MG tablet Take 1 mg by mouth 2 (two) times daily.   LORazepam (ATIVAN) 1 MG tablet Take 1 mg by mouth as needed for anxiety.   losartan (COZAAR) 100 MG tablet Take 100 mg by mouth daily.   omeprazole (PRILOSEC) 20 MG capsule Take 20 mg by mouth daily.   pregabalin (LYRICA) 100 MG capsule Take 100 mg by mouth daily.   senna (SENOKOT) 8.6 MG TABS tablet Take 1 tablet by mouth daily.   tamsulosin (FLOMAX) 0.4 MG CAPS capsule Take 0.4 mg by mouth daily.   zinc oxide 20 % ointment Apply 1 application topically as needed for irritation.   [DISCONTINUED] Lidocaine 4 % PTCH Apply 1 patch topically daily as needed. (Patient not taking: Reported on 09/12/2021)   [DISCONTINUED] MORPHINE SULFATE, CONCENTRATE, PO Take 100 mLs by mouth in the morning, at noon, in the evening, and at bedtime. (Patient not taking: Reported on 09/12/2021)   No facility-administered encounter medications on file as of 09/12/2021.    Review of Systems  Unable to perform ROS: Dementia   Immunization History  Administered Date(s) Administered   Influenza Split 06/09/2015, 04/04/2016, 02/27/2017   Influenza,inj,Quad PF,6-35 Mos 04/03/2019, 04/06/2020   Influenza-Unspecified 05/04/2021   PFIZER Comirnaty(Gray Top)Covid-19 Tri-Sucrose Vaccine 12/15/2020   PFIZER(Purple Top)SARS-COV-2 Vaccination 08/25/2019, 09/16/2019, 05/03/2020   Pfizer Covid-19 Vaccine Bivalent Booster 72yr & up 03/30/2021   Tdap 08/01/2010   Zoster, Live 08/24/2016   Pertinent  Health Maintenance Due  Topic Date Due   COLONOSCOPY (Pts 45-492yrInsurance coverage will need to be confirmed)  Never done   INFLUENZA VACCINE  Completed   Fall Risk 03/01/2015 03/12/2015  Falls in the past year? No No  Patient at Risk for Falls  Due to - Other (Comment)   Functional Status Survey:    Vitals:   09/12/21 1040  BP: 114/82  Pulse: 85  Resp: 19  Temp: (!) 97.1 F (36.2 C)  SpO2: 95%  Weight: 154 lb 8 oz (70.1 kg)  Height: '6\' 4"'$  (1.93 m)   Body mass index is 18.81 kg/m. Physical Exam Vitals reviewed.  Constitutional:      General: He is not in acute distress. HENT:     Head: Normocephalic.     Right Ear: There is no impacted cerumen.     Left Ear: There is no impacted cerumen.     Nose: Nose normal.     Mouth/Throat:     Mouth: Mucous membranes are moist.  Eyes:     General:        Right eye: No discharge.        Left eye: No discharge.  Cardiovascular:     Rate and Rhythm: Normal rate and regular rhythm.     Pulses: Normal pulses.     Heart sounds: Normal heart sounds.  Pulmonary:     Effort: Pulmonary effort is normal. No respiratory distress.  Breath sounds: Normal breath sounds. No wheezing.  Abdominal:     General: Bowel sounds are normal. There is no distension.     Palpations: Abdomen is soft.     Tenderness: There is no abdominal tenderness.  Musculoskeletal:     Cervical back: Neck supple.     Right lower leg: No edema.     Left lower leg: No edema.  Skin:    General: Skin is warm and dry.     Capillary Refill: Capillary refill takes less than 2 seconds.  Neurological:     General: No focal deficit present.     Mental Status: He is alert. Mental status is at baseline.     Motor: Weakness present.     Gait: Gait abnormal.     Comments: Bed bound, hoyer, rigidity of limbs present  Psychiatric:        Mood and Affect: Mood normal.        Behavior: Behavior normal.        Cognition and Memory: Memory is impaired.     Comments: Nonverbal, alert to self only    Labs reviewed: Recent Labs    09/13/20 0000 01/27/21 0000 03/24/21 0000  NA 143 145 144  K 3.8 3.7 3.8  CL 108 106 106  CO2 29* 32* 34*  BUN 21 25* 24*  CREATININE 1.2 1.1 1.0  CALCIUM 9.4 9.5 9.2   Recent  Labs    01/06/21 0000 01/24/21 0000 01/27/21 0000  AST 17 15 12*  ALT '21 16 13  '$ ALKPHOS 51 57 55  ALBUMIN 3.8 3.7 3.9   Recent Labs    09/13/20 0000 10/28/20 0000 12/14/20 0000 01/24/21 0000 01/27/21 0000  WBC 6.5   < > 7.5 6.3 5.8  NEUTROABS 4,264.00  --   --   --  3,579.00  HGB 15.5   < > 15.5 14.9 14.4  HCT 46   < > 47 44 43  PLT 246   < > 319 279 267   < > = values in this interval not displayed.   Lab Results  Component Value Date   TSH 1.46 09/13/2020   No results found for: HGBA1C Lab Results  Component Value Date   CHOL 177 09/13/2020   HDL 45 09/13/2020   LDLCALC 109 09/13/2020    Significant Diagnostic Results in last 30 days:  No results found.  Assessment/Plan 1. Late onset Alzheimer's disease with behavioral disturbance ([place]) - late stages- progressive weight loss, bed bound, requiring meal assistance - followed by hospice - increased discomfort with ADLs - increase roxanol 5 mg to 5x/daily - cont Depakote, Haldol and Ativan  2. Weight loss - see above - cont monthly weight  3. Primary hypertension - trending down - will discontinue HCTZ  4. Pure hypercholesterolemia - statin discontinued  5. Chronic low back pain, unspecified back pain laterality, unspecified whether sciatica present - increased discomfort with ADLs - see above - cont scheduled tylenol and Roxanol  6. Benign prostatic hyperplasia, unspecified whether lower urinary tract symptoms present - cont Flomax  6. Idiopathic peripheral neuropathy - cont Lyrica  7. Depression, recurrent ([place]) - off Cymbalta  8. Gastroesophageal reflux disease, unspecified whether esophagitis present - cont omeprazole    Family/ staff Communication: plan discussed with wife and nurse  Labs/tests ordered:  none

## 2021-09-26 ENCOUNTER — Other Ambulatory Visit: Payer: Self-pay

## 2021-09-26 DIAGNOSIS — F02818 Dementia in other diseases classified elsewhere, unspecified severity, with other behavioral disturbance: Secondary | ICD-10-CM

## 2021-09-26 DIAGNOSIS — M545 Low back pain, unspecified: Secondary | ICD-10-CM

## 2021-09-26 MED ORDER — MORPHINE SULFATE (CONCENTRATE) 20 MG/ML PO SOLN: 5.0000 mg | Freq: Every day | ORAL | 0 refills | Status: DC

## 2021-09-26 NOTE — Telephone Encounter (Signed)
Incoming refill request received from [facility] for Morphine  ? ?RX last refilled in epic on 09/12/2021 ? ?Question if high dispense to be provided being last request was 14 days ago ?

## 2021-10-05 ENCOUNTER — Encounter: Payer: Self-pay | Admitting: Internal Medicine

## 2021-10-06 NOTE — Telephone Encounter (Signed)
Per Cindi Carbon:  ?the message you sent me on mr. pearce, is there anyway that Man Darlina Rumpf could help since she is the [place] NP?  I am covering Dr Lyndel Safe and AMy here and figured it would make more sense for [facility] to cover at [facility] ? ? ?Message forwarded to [facility].  ?

## 2021-10-06 NOTE — Telephone Encounter (Signed)
[facility]. Forwarded to [employer] due to Amy out of office.  ?

## 2021-10-19 ENCOUNTER — Encounter: Payer: Self-pay | Admitting: Internal Medicine

## 2021-10-20 ENCOUNTER — Encounter: Payer: Self-pay | Admitting: Internal Medicine

## 2021-10-20 ENCOUNTER — Non-Acute Institutional Stay (SKILLED_NURSING_FACILITY): Payer: Medicare Other | Admitting: Internal Medicine

## 2021-10-20 DIAGNOSIS — G609 Hereditary and idiopathic neuropathy, unspecified: Secondary | ICD-10-CM

## 2021-10-20 DIAGNOSIS — R634 Abnormal weight loss: Secondary | ICD-10-CM

## 2021-10-20 DIAGNOSIS — G301 Alzheimer's disease with late onset: Secondary | ICD-10-CM | POA: Diagnosis not present

## 2021-10-20 DIAGNOSIS — F02818 Dementia in other diseases classified elsewhere, unspecified severity, with other behavioral disturbance: Secondary | ICD-10-CM

## 2021-10-20 DIAGNOSIS — I10 Essential (primary) hypertension: Secondary | ICD-10-CM

## 2021-10-20 DIAGNOSIS — M545 Low back pain, unspecified: Secondary | ICD-10-CM | POA: Diagnosis not present

## 2021-10-20 DIAGNOSIS — K219 Gastro-esophageal reflux disease without esophagitis: Secondary | ICD-10-CM

## 2021-10-20 DIAGNOSIS — G8929 Other chronic pain: Secondary | ICD-10-CM

## 2021-10-20 DIAGNOSIS — N400 Enlarged prostate without lower urinary tract symptoms: Secondary | ICD-10-CM

## 2021-10-20 NOTE — Progress Notes (Signed)
?Location:   [place] Room Number: 8 ?Place of Service:  SNF (31) ?Provider:  Veleta Miners MD ? ?Virgie Dad, MD ? ?Patient Care Team: ?Virgie Dad, MD as PCP - General (Internal Medicine) ? ?Extended Emergency Contact Information ?Primary Emergency Contact: Leung,Donna ?Address: [address] ?         [place], [place] [address] [place] ?Home Phone: [phone] ?Mobile Phone: [phone] ?Relation: Spouse ? ?Code Status:  DNR Hospice ?Goals of care: Advanced Directive information ? ?  10/20/2021  ?  9:53 AM  ?Advanced Directives  ?Does Patient Have a Medical Advance Directive? Yes  ?Type of Paramedic of [place];Living will;Out of facility DNR (pink MOST or yellow form)  ?Does patient want to make changes to medical advance directive? No - Patient declined  ?Copy of [place] in Chart? Yes - validated most recent copy scanned in chart (See row information)  ?Pre-existing out of facility DNR order (yellow form or pink MOST form) Pink MOST form placed in chart (order not valid for inpatient use);Yellow form placed in chart (order not valid for inpatient use)  ? ? ? ?Chief Complaint  ?Patient presents with  ? Medical Management of Chronic Issues  ? Quality Metric Gaps  ?  Verified [facility] and [facility] patient is due for PCV, Hepatitis C Screening, Colonoscopy, Shingrix, and TDAP.  ?  ? ? ?HPI:  ?Pt is a 66 y.o. male seen today for medical management of chronic diseases.   ? ?Patient lives in [place] and is enrolled in Hospice ?Patient has a history of Alzheimer's dementia with behavioral disturbance.  Has had extensive work-up in [facility]. ?Was diagnosed at age of 79.  Had MRI done which showed severe atrophy and small vessel ischemia.  Has been on Aricept and Namenda. ?Has history of chronic back pain ? ?He is now end stage Dementia ?Total ADLS care with [facility] lift ?Staff is feeding him ?Continues to loose weight  ?No Falls  reported  ?Unable to give history due to his Aphasia ?Wt Readings from Last 3 Encounters:  ?10/20/21 155 lb (70.3 kg)  ?09/12/21 154 lb 8 oz (70.1 kg)  ?09/01/21 165 lb (74.8 kg)  ?  ? ?Past Medical History:  ?Diagnosis Date  ? Hyperlipidemia   ? Hypertension   ? Seasonal allergies   ? ?Past Surgical History:  ?Procedure Laterality Date  ? HAMMERTOE RECONSTRUCTION WITH WEIL OSTEOTOMY  08/01/2012  ? Procedure: HAMMERTOE RECONSTRUCTION WITH WEIL OSTEOTOMY;  Surgeon: Wylene Simmer, MD;  Location: [facility];  Service: Orthopedics;  Laterality: Right;  RIGHT SCARF OSTEOTOMY, MODIFIED MCBRIDE BUNIONECTOMY, , RIGHT 2ND - 4TH WEIL, RIGHT 2ND - 5TH DORSAL CAPSULOTOMIES, RIGHT 2ND - 4TH PIP ARTHRODESIS  ? LIPOMA EXCISION  08/01/2012  ? Procedure: EXCISION LIPOMA;  Surgeon: Rolm Bookbinder, MD;  Location: [facility];  Service: General;  Laterality: N/A;  EXCISION LIPOMAS X 6 (LEFT TRICEP, RIGHT TRICEP, DELTOID, BIL ABDOMINAL WALL AND LEFT THIGH)  ? NASAL SINUS SURGERY    ? SHOULDER SURGERY    ? right  ? ? ?No Known Allergies ? ?Allergies as of 10/20/2021   ?No Known Allergies ?  ? ?  ?Medication List  ?  ? ?  ? Accurate as of October 20, 2021  9:54 AM. If you have any questions, ask your nurse or doctor.  ?  ?  ? ?  ? ?STOP taking these medications   ? ?  hydrochlorothiazide 25 MG tablet ?Commonly known as: HYDRODIURIL ?Stopped by: Virgie Dad, MD ?  ?pregabalin 100 MG capsule ?Commonly known as: LYRICA ?Stopped by: Virgie Dad, MD ?  ? ?  ? ?TAKE these medications   ? ?acetaminophen 500 MG tablet ?Commonly known as: TYLENOL ?Take 1,000 mg by mouth 3 (three) times daily. ?  ?cetirizine 10 MG tablet ?Commonly known as: ZYRTEC ?Take 10 mg by mouth daily. ?  ?divalproex 125 MG DR tablet ?Commonly known as: DEPAKOTE ?Take 125 mg by mouth 2 (two) times daily. ?  ?divalproex 250 MG DR tablet ?Commonly known as: DEPAKOTE ?Take 250 mg by mouth daily. Between 7:30 to 11 pm, See other listing. ?   ?haloperidol 0.5 MG tablet ?Commonly known as: HALDOL ?Take 0.5 mg by mouth 3 (three) times daily. ?  ?LORazepam 1 MG tablet ?Commonly known as: ATIVAN ?Take 1 mg by mouth 2 (two) times daily. ?  ?LORazepam 1 MG tablet ?Commonly known as: ATIVAN ?Take 1 mg by mouth as needed for anxiety. ?  ?losartan 100 MG tablet ?Commonly known as: COZAAR ?Take 100 mg by mouth daily. ?  ?morphine 20 MG/ML concentrated solution ?Commonly known as: ROXANOL ?Take 0.25 mLs (5 mg total) by mouth 5 (five) times daily. ?  ?omeprazole 20 MG capsule ?Commonly known as: PRILOSEC ?Take 20 mg by mouth daily. ?  ?senna 8.6 MG Tabs tablet ?Commonly known as: SENOKOT ?Take 1 tablet by mouth daily. ?  ?tamsulosin 0.4 MG Caps capsule ?Commonly known as: FLOMAX ?Take 0.4 mg by mouth daily. ?  ?zinc oxide 20 % ointment ?Apply 1 application topically as needed for irritation. ?  ? ?  ? ? ?Review of Systems  ?Unable to perform ROS: Dementia  ? ?Immunization History  ?Administered Date(s) Administered  ? Influenza Split 06/09/2015, 04/04/2016, 02/27/2017  ? Influenza,inj,Quad PF,6-35 Mos 04/03/2019, 04/06/2020  ? Influenza-Unspecified 05/04/2021  ? PFIZER Comirnaty(Gray Top)Covid-19 Tri-Sucrose Vaccine 12/15/2020  ? PFIZER(Purple Top)SARS-COV-2 Vaccination 08/25/2019, 09/16/2019, 05/03/2020  ? Pension scheme manager 72yr & up 03/30/2021  ? Tdap 08/01/2010  ? Zoster, Live 08/24/2016  ? Zoster, Unspecified 08/24/2016  ? ?Pertinent  Health Maintenance Due  ?Topic Date Due  ? COLONOSCOPY (Pts 45-427yrInsurance coverage will need to be confirmed)  Never done  ? INFLUENZA VACCINE  02/07/2022  ? ? ?  03/01/2015  ?  2:32 PM 03/12/2015  ?  9:47 AM  ?Fall Risk  ?Falls in the past year? No No  ?Patient at Risk for Falls Due to  Other (Comment)  ? ?Functional Status Survey: ?  ? ?Vitals:  ? 10/20/21 0926  ?BP: 113/78  ?Pulse: 75  ?Resp: 17  ?Temp: (!) 97 ?F (36.1 ?C)  ?SpO2: 94%  ?Weight: 155 lb (70.3 kg)  ?Height: '6\' 4"'$  (1.93 m)  ? ?Body mass  index is 18.87 kg/m?. [place] KitchenPhysical Exam ?Vitals reviewed.  ?Constitutional:   ?   Comments: Frail and Sleepy today  ?HENT:  ?   Head: Normocephalic.  ?   Nose: Nose normal.  ?   Mouth/Throat:  ?   Mouth: Mucous membranes are moist.  ?   Pharynx: Oropharynx is clear.  ?Eyes:  ?   Pupils: Pupils are equal, round, and reactive to light.  ?Cardiovascular:  ?   Rate and Rhythm: Normal rate and regular rhythm.  ?   Pulses: Normal pulses.  ?   Heart sounds: No murmur heard. ?Pulmonary:  ?   Effort: Pulmonary effort is normal. No respiratory distress.  ?  Breath sounds: Normal breath sounds. No rales.  ?Abdominal:  ?   General: Abdomen is flat. Bowel sounds are normal.  ?   Palpations: Abdomen is soft.  ?Musculoskeletal:     ?   General: No swelling.  ?   Cervical back: Neck supple.  ?Skin: ?   General: Skin is warm.  ?Neurological:  ?   General: No focal deficit present.  ?   Comments: Increase rigidity in all extremeties  ?Psychiatric:     ?   Mood and Affect: Mood normal.     ?   Thought Content: Thought content normal.  ? ? ?Labs reviewed: ?Recent Labs  ?  01/27/21 ?0000 03/24/21 ?0000  ?NA 145 144  ?K 3.7 3.8  ?CL 106 106  ?CO2 32* 34*  ?BUN 25* 24*  ?CREATININE 1.1 1.0  ?CALCIUM 9.5 9.2  ? ?Recent Labs  ?  01/06/21 ?0000 01/24/21 ?0000 01/27/21 ?0000  ?AST 17 15 12*  ?ALT '21 16 13  '$ ?ALKPHOS 51 57 55  ?ALBUMIN 3.8 3.7 3.9  ? ?Recent Labs  ?  12/14/20 ?0000 01/24/21 ?0000 01/27/21 ?0000  ?WBC 7.5 6.3 5.8  ?NEUTROABS  --   --  3,579.00  ?HGB 15.5 14.9 14.4  ?HCT 47 44 43  ?PLT 319 279 267  ? ?Lab Results  ?Component Value Date  ? TSH 1.46 09/13/2020  ? ?No results found for: HGBA1C ?Lab Results  ?Component Value Date  ? CHOL 177 09/13/2020  ? HDL 45 09/13/2020  ? [place] 109 09/13/2020  ? ? ?Significant Diagnostic Results in last 30 days:  ?No results found. ? ?Assessment/Plan ?1. Chronic low back pain, unspecified back pain laterality, unspecified whether sciatica present ?On Roxanol for Pain control ? ?2. Late onset  Alzheimer's disease with behavioral disturbance ([place]) ?Enrolled in hospice ?Depakote and Haldol for behaviors ?3. Weight loss ?Due to his worsening dementia ?Enrolled with hospice for comfort ? ?4. Primary hypertension ?

## 2021-11-07 ENCOUNTER — Other Ambulatory Visit: Payer: Self-pay

## 2021-11-07 ENCOUNTER — Non-Acute Institutional Stay (SKILLED_NURSING_FACILITY): Payer: Medicare Other | Admitting: Orthopedic Surgery

## 2021-11-07 ENCOUNTER — Encounter: Payer: Self-pay | Admitting: Orthopedic Surgery

## 2021-11-07 DIAGNOSIS — G8929 Other chronic pain: Secondary | ICD-10-CM

## 2021-11-07 DIAGNOSIS — T148XXA Other injury of unspecified body region, initial encounter: Secondary | ICD-10-CM

## 2021-11-07 DIAGNOSIS — R634 Abnormal weight loss: Secondary | ICD-10-CM

## 2021-11-07 DIAGNOSIS — K219 Gastro-esophageal reflux disease without esophagitis: Secondary | ICD-10-CM

## 2021-11-07 DIAGNOSIS — G301 Alzheimer's disease with late onset: Secondary | ICD-10-CM | POA: Diagnosis not present

## 2021-11-07 DIAGNOSIS — N400 Enlarged prostate without lower urinary tract symptoms: Secondary | ICD-10-CM

## 2021-11-07 DIAGNOSIS — F02818 Dementia in other diseases classified elsewhere, unspecified severity, with other behavioral disturbance: Secondary | ICD-10-CM

## 2021-11-07 DIAGNOSIS — M545 Low back pain, unspecified: Secondary | ICD-10-CM

## 2021-11-07 DIAGNOSIS — I10 Essential (primary) hypertension: Secondary | ICD-10-CM

## 2021-11-07 NOTE — Telephone Encounter (Signed)
Already signed and faxed back this morning.

## 2021-11-07 NOTE — Progress Notes (Signed)
?Location:  [facility] Room Number: 8-A ?Place of Service:  SNF (31) ?Provider:  Yvonna Alanis, NP  ? ?Virgie Dad, MD ? ?Patient Care Team: ?Virgie Dad, MD as PCP - General (Internal Medicine) ? ?Extended Emergency Contact Information ?Primary Emergency Contact: Burbridge,Donna ?Address: [address] ?         [place], [place] [address] [place] ?Home Phone: [phone] ?Mobile Phone: [phone] ?Relation: Spouse ? ?Code Status:  DNR ?Goals of care: Advanced Directive information ? ?  11/07/2021  ? 10:55 AM  ?Advanced Directives  ?Does Patient Have a Medical Advance Directive? Yes  ?Type of Paramedic of [place];Living will;Out of facility DNR (pink MOST or yellow form)  ?Does patient want to make changes to medical advance directive? No - Patient declined  ?Copy of [place] in Chart? Yes - validated most recent copy scanned in chart (See row information)  ?Pre-existing out of facility DNR order (yellow form or pink MOST form) Pink MOST form placed in chart (order not valid for inpatient use);Yellow form placed in chart (order not valid for inpatient use)  ? ? ? ?Chief Complaint  ?Patient presents with  ? Acute Visit  ?  Skin blisters.  ? ? ?HPI:  ?Pt is a 66 y.o. male seen today for acute visit due to skin blisters.  ? ?04/29 nursing staff reports 2 large blisters to right upper arm and one small blister to left pinky. He is a poor historian due to late stage Alzheimer's dementia. Followed by hospice. Remains total care with ADLs, including feeding.  ? ?Alzheimer's- see above, remains on Depakote,Haldol and Ativan ?Weight loss- see weights below, eating about 50-75% of most meals ?HTN- BUN/creat 24/1.0 03/24/2021, remains on losartan, off HCTZ ?Chronic back pain- remains on scheduled roxanol ?BPH- remains on Flomax ?GERD- hgb 14.4 01/2021, remains on Prilosec ? ? ?Past Medical History:  ?Diagnosis Date  ? Hyperlipidemia   ?  Hypertension   ? Seasonal allergies   ? ?Past Surgical History:  ?Procedure Laterality Date  ? HAMMERTOE RECONSTRUCTION WITH WEIL OSTEOTOMY  08/01/2012  ? Procedure: HAMMERTOE RECONSTRUCTION WITH WEIL OSTEOTOMY;  Surgeon: Wylene Simmer, MD;  Location: [facility];  Service: Orthopedics;  Laterality: Right;  RIGHT SCARF OSTEOTOMY, MODIFIED MCBRIDE BUNIONECTOMY, , RIGHT 2ND - 4TH WEIL, RIGHT 2ND - 5TH DORSAL CAPSULOTOMIES, RIGHT 2ND - 4TH PIP ARTHRODESIS  ? LIPOMA EXCISION  08/01/2012  ? Procedure: EXCISION LIPOMA;  Surgeon: Rolm Bookbinder, MD;  Location: [facility];  Service: General;  Laterality: N/A;  EXCISION LIPOMAS X 6 (LEFT TRICEP, RIGHT TRICEP, DELTOID, BIL ABDOMINAL WALL AND LEFT THIGH)  ? NASAL SINUS SURGERY    ? SHOULDER SURGERY    ? right  ? ? ?No Known Allergies ? ?Outpatient Encounter Medications as of 11/07/2021  ?Medication Sig  ? acetaminophen (TYLENOL) 500 MG tablet Take 1,000 mg by mouth 3 (three) times daily.  ? cetirizine (ZYRTEC) 10 MG tablet Take 10 mg by mouth daily.  ? divalproex (DEPAKOTE) 125 MG DR tablet Take 125 mg by mouth 2 (two) times daily.  ? divalproex (DEPAKOTE) 250 MG DR tablet Take 250 mg by mouth daily. Between 7:30 to 11 pm, See other listing.  ? haloperidol (HALDOL) 0.5 MG tablet Take 0.5 mg by mouth 2 (two) times daily.  ? LORazepam (ATIVAN) 1 MG tablet Take 1 mg by mouth 2 (two) times daily.  ? losartan (COZAAR) 100 MG tablet  Take 100 mg by mouth daily.  ? morphine (ROXANOL) 20 MG/ML concentrated solution Take by mouth every 2 (two) hours as needed for severe pain.  ? morphine (ROXANOL) 20 MG/ML concentrated solution Take by mouth every 4 (four) hours.  ? omeprazole (PRILOSEC) 20 MG capsule Take 20 mg by mouth daily.  ? senna (SENOKOT) 8.6 MG TABS tablet Take 1 tablet by mouth daily.  ? tamsulosin (FLOMAX) 0.4 MG CAPS capsule Take 0.4 mg by mouth daily.  ? zinc oxide 20 % ointment Apply 1 application topically as needed for irritation.  ?  [DISCONTINUED] LORazepam (ATIVAN) 1 MG tablet Take 1 mg by mouth as needed for anxiety.  ? [DISCONTINUED] morphine (ROXANOL) 20 MG/ML concentrated solution Take 0.25 mLs (5 mg total) by mouth 5 (five) times daily.  ? ?No facility-administered encounter medications on file as of 11/07/2021.  ? ? ?Review of Systems ? ?Immunization History  ?Administered Date(s) Administered  ? Influenza Split 06/09/2015, 04/04/2016, 02/27/2017  ? Influenza,inj,Quad PF,6-35 Mos 04/03/2019, 04/06/2020  ? Influenza-Unspecified 05/04/2021  ? PFIZER Comirnaty(Gray Top)Covid-19 Tri-Sucrose Vaccine 12/15/2020  ? PFIZER(Purple Top)SARS-COV-2 Vaccination 08/25/2019, 09/16/2019, 05/03/2020  ? Pension scheme manager 57yr & up 03/30/2021  ? Tdap 08/01/2010  ? Zoster, Live 08/24/2016  ? Zoster, Unspecified 08/24/2016  ? ?Pertinent  Health Maintenance Due  ?Topic Date Due  ? COLONOSCOPY (Pts [phone] coverage will need to be confirmed)  Never done  ? INFLUENZA VACCINE  02/07/2022  ? ? ?  03/01/2015  ?  2:32 PM 03/12/2015  ?  9:47 AM  ?Fall Risk  ?Falls in the past year? No No  ?Patient at Risk for Falls Due to  Other (Comment)  ? ?Functional Status Survey: ?  ? ?Vitals:  ? 11/07/21 1033  ?BP: 137/90  ?Pulse: (!) 102  ?Resp: 20  ?Temp: (!) 97.3 ?F (36.3 ?C)  ?SpO2: 96%  ?Weight: 155 lb (70.3 kg)  ?Height: '6\' 4"'$  (1.93 m)  ? ?Body mass index is 18.87 kg/m?. Marland KitchenPhysical Exam ?Vitals reviewed.  ?Constitutional:   ?   General: He is not in acute distress. ?HENT:  ?   Head: Normocephalic.  ?Eyes:  ?   General:     ?   Right eye: No discharge.     ?   Left eye: No discharge.  ?Neck:  ?   Vascular: No carotid bruit.  ?Cardiovascular:  ?   Rate and Rhythm: Normal rate and regular rhythm.  ?   Pulses: Normal pulses.  ?   Heart sounds: Normal heart sounds.  ?Pulmonary:  ?   Effort: Pulmonary effort is normal. No respiratory distress.  ?   Breath sounds: Normal breath sounds. No wheezing.  ?Abdominal:  ?   General: Bowel sounds are normal.  There is no distension.  ?   Palpations: Abdomen is soft.  ?   Tenderness: There is no abdominal tenderness.  ?Musculoskeletal:  ?   Cervical back: Neck supple.  ?   Right lower leg: No edema.  ?   Left lower leg: No edema.  ?Lymphadenopathy:  ?   Cervical: No cervical adenopathy.  ?Skin: ?   General: Skin is warm and dry.  ?   Capillary Refill: Capillary refill takes less than 2 seconds.  ?   Comments: 2 quarter sized blisters to right upper arm, unruptured, non tender to touch, surrounding skin intact.   ?Neurological:  ?   General: No focal deficit present.  ?   Mental Status: He is easily  aroused. Mental status is at baseline.  ?   Motor: Weakness present.  ?   Gait: Gait abnormal.  ?   Comments: bedbound  ?Psychiatric:  ?   Comments: Non verbal, does not follow commands  ? ? ?Labs reviewed: ?Recent Labs  ?  01/27/21 ?0000 03/24/21 ?0000  ?NA 145 144  ?K 3.7 3.8  ?CL 106 106  ?CO2 32* 34*  ?BUN 25* 24*  ?CREATININE 1.1 1.0  ?CALCIUM 9.5 9.2  ? ?Recent Labs  ?  01/06/21 ?0000 01/24/21 ?0000 01/27/21 ?0000  ?AST 17 15 12*  ?ALT '21 16 13  '$ ?ALKPHOS 51 57 55  ?ALBUMIN 3.8 3.7 3.9  ? ?Recent Labs  ?  12/14/20 ?0000 01/24/21 ?0000 01/27/21 ?0000  ?WBC 7.5 6.3 5.8  ?NEUTROABS  --   --  3,579.00  ?HGB 15.5 14.9 14.4  ?HCT 47 44 43  ?PLT 319 279 267  ? ?Lab Results  ?Component Value Date  ? TSH 1.46 09/13/2020  ? ?No results found for: HGBA1C ?Lab Results  ?Component Value Date  ? CHOL 177 09/13/2020  ? HDL 45 09/13/2020  ? [place] 109 09/13/2020  ? ? ?Significant Diagnostic Results in last 30 days:  ?No results found. ? ?Assessment/Plan ?1. Friction blisters of the skin ?- located to right upper arm ?- blisters intact, no sign of infection ?- skin fragile, suspect from turning ?- recommend applying foam dressing prn ?- educated staff about avoiding right upper arm when turning ? ?2. Late onset Alzheimer's disease with behavioral disturbance ([place]) ?- followed by hospice ?- no behavioral outbursts, total care with ADLs ?-  cont depakote, haldol and ativan ? ?3. Weight loss ?- cont monthly weights ? ?4. Primary hypertension ?- off HCTZ ?- cont losartan ? ?5. Chronic low back pain, unspecified back pain laterality, unspecified whethe

## 2021-11-07 NOTE — Telephone Encounter (Signed)
Incoming fax received from [facility]  ?

## 2021-11-14 ENCOUNTER — Encounter: Payer: Self-pay | Admitting: Orthopedic Surgery

## 2021-11-14 ENCOUNTER — Non-Acute Institutional Stay (SKILLED_NURSING_FACILITY): Payer: Medicare Other | Admitting: Orthopedic Surgery

## 2021-11-14 DIAGNOSIS — R509 Fever, unspecified: Secondary | ICD-10-CM | POA: Diagnosis not present

## 2021-11-14 DIAGNOSIS — Z515 Encounter for palliative care: Secondary | ICD-10-CM | POA: Diagnosis not present

## 2021-11-14 NOTE — Progress Notes (Signed)
?Location:  [facility] Room Number: N08/A ?Place of Service:  SNF (31) ?Provider: Yvonna Alanis, NP ? ? ?Patient Care Team: ?Virgie Dad, MD as PCP - General (Internal Medicine) ? ?Extended Emergency Contact Information ?Primary Emergency Contact: Alamillo,Donna ?Address: [address] ?         [place], [place] [address] [place] ?Home Phone: [phone] ?Mobile Phone: [phone] ?Relation: Spouse ? ?Code Status:  DNR ?Goals of care: Advanced Directive information ? ?  11/14/2021  ? 10:30 AM  ?Advanced Directives  ?Does Patient Have a Medical Advance Directive? Yes  ?Type of Paramedic of [place];Living will;Out of facility DNR (pink MOST or yellow form)  ?Does patient want to make changes to medical advance directive? No - Patient declined  ?Copy of [place] in Chart? Yes - validated most recent copy scanned in chart (See row information)  ?Pre-existing out of facility DNR order (yellow form or pink MOST form) Pink MOST form placed in chart (order not valid for inpatient use);Yellow form placed in chart (order not valid for inpatient use)  ? ? ? ?Chief Complaint  ?Patient presents with  ? Acute Visit  ?  Fever  ? ? ?HPI:  ?Pt is a 66 y.o. male seen today for an acute visit for fever.  ? ?He currently resides on the skilled nursing unit at [facility] due to Alzheimer's dementia. Followed by hospice. His temperature was 101.8 this morning. O2 sats 87% on RA. He was placed on 2 liters oxygen and sats > 90%. Oxygen discontinued per wife request, breathing does not appear labored. Roxanol every 2 hours. He is unresponsive at this time. Covid test negative. Treatment options discussed with wife, comfort care only.  ? ? ?Past Medical History:  ?Diagnosis Date  ? Hyperlipidemia   ? Hypertension   ? Seasonal allergies   ? ?Past Surgical History:  ?Procedure Laterality Date  ? HAMMERTOE RECONSTRUCTION WITH WEIL OSTEOTOMY  08/01/2012   ? Procedure: HAMMERTOE RECONSTRUCTION WITH WEIL OSTEOTOMY;  Surgeon: Wylene Simmer, MD;  Location: [facility];  Service: Orthopedics;  Laterality: Right;  RIGHT SCARF OSTEOTOMY, MODIFIED MCBRIDE BUNIONECTOMY, , RIGHT 2ND - 4TH WEIL, RIGHT 2ND - 5TH DORSAL CAPSULOTOMIES, RIGHT 2ND - 4TH PIP ARTHRODESIS  ? LIPOMA EXCISION  08/01/2012  ? Procedure: EXCISION LIPOMA;  Surgeon: Rolm Bookbinder, MD;  Location: [facility];  Service: General;  Laterality: N/A;  EXCISION LIPOMAS X 6 (LEFT TRICEP, RIGHT TRICEP, DELTOID, BIL ABDOMINAL WALL AND LEFT THIGH)  ? NASAL SINUS SURGERY    ? SHOULDER SURGERY    ? right  ? ? ?No Known Allergies ? ?Outpatient Encounter Medications as of 11/14/2021  ?Medication Sig  ? acetaminophen (TYLENOL) 500 MG tablet Take 1,000 mg by mouth 3 (three) times daily.  ? cetirizine (ZYRTEC) 10 MG tablet Take 10 mg by mouth daily.  ? divalproex (DEPAKOTE) 125 MG DR tablet Take 125 mg by mouth 2 (two) times daily.  ? divalproex (DEPAKOTE) 250 MG DR tablet Take 250 mg by mouth daily. Between 7:30 to 11 pm, See other listing.  ? haloperidol (HALDOL) 0.5 MG tablet Take 0.5 mg by mouth 2 (two) times daily.  ? LORazepam (ATIVAN) 1 MG tablet Take 1 mg by mouth 2 (two) times daily.  ? losartan (COZAAR) 100 MG tablet Take 100 mg by mouth daily.  ? morphine (ROXANOL) 20 MG/ML concentrated solution Take by mouth every 2 (two) hours as  needed for severe pain.  ? morphine (ROXANOL) 20 MG/ML concentrated solution Take by mouth every 4 (four) hours.  ? omeprazole (PRILOSEC) 20 MG capsule Take 20 mg by mouth daily.  ? senna (SENOKOT) 8.6 MG TABS tablet Take 1 tablet by mouth daily.  ? tamsulosin (FLOMAX) 0.4 MG CAPS capsule Take 0.4 mg by mouth daily.  ? zinc oxide 20 % ointment Apply 1 application topically as needed for irritation.  ? ?No facility-administered encounter medications on file as of 11/14/2021.  ? ? ?Review of Systems  ?Unable to perform ROS: Dementia  ? ?Immunization History   ?Administered Date(s) Administered  ? Influenza Split 06/09/2015, 04/04/2016, 02/27/2017  ? Influenza,inj,Quad PF,6-35 Mos 04/03/2019, 04/06/2020  ? Influenza-Unspecified 05/04/2021  ? PFIZER Comirnaty(Gray Top)Covid-19 Tri-Sucrose Vaccine 12/15/2020  ? PFIZER(Purple Top)SARS-COV-2 Vaccination 08/25/2019, 09/16/2019, 05/03/2020  ? Pension scheme manager 62yr & up 03/30/2021  ? Tdap 08/01/2010  ? Zoster, Live 08/24/2016  ? Zoster, Unspecified 08/24/2016  ? ?Pertinent  Health Maintenance Due  ?Topic Date Due  ? COLONOSCOPY (Pts [phone] coverage will need to be confirmed)  Never done  ? INFLUENZA VACCINE  02/07/2022  ? ? ?  03/01/2015  ?  2:32 PM 03/12/2015  ?  9:47 AM  ?Fall Risk  ?Falls in the past year? No No  ?Patient at Risk for Falls Due to  Other (Comment)  ? ?Functional Status Survey: ?  ? ?Vitals:  ? 11/14/21 1024  ?BP: 116/80  ?Pulse: 89  ?Resp: 20  ?Temp: 98 ?F (36.7 ?C)  ?SpO2: 92%  ?Weight: 133 lb 8 oz (60.6 kg)  ?Height: '6\' 4"'$  (1.93 m)  ? ?Body mass index is 16.25 kg/m?. Marland KitchenPhysical Exam ?Vitals reviewed.  ?Constitutional:   ?   Appearance: He is ill-appearing.  ?   Comments: Unresponsive  ?Eyes:  ?   General:     ?   Right eye: No discharge.     ?   Left eye: No discharge.  ?Cardiovascular:  ?   Rate and Rhythm: Regular rhythm. Tachycardia present.  ?   Pulses: Normal pulses.  ?   Heart sounds: Normal heart sounds.  ?Pulmonary:  ?   Effort: Pulmonary effort is normal. No respiratory distress.  ?   Breath sounds: Examination of the left-middle field reveals decreased breath sounds. Examination of the left-lower field reveals decreased breath sounds. Decreased breath sounds present. No wheezing or rales.  ?Abdominal:  ?   General: Bowel sounds are normal. There is no distension.  ?   Palpations: Abdomen is soft.  ?   Tenderness: There is no abdominal tenderness.  ?Musculoskeletal:  ?   Cervical back: Neck supple.  ?   Right lower leg: No edema.  ?   Left lower leg: No edema.   ?Skin: ?   General: Skin is warm.  ?   Capillary Refill: Capillary refill takes less than 2 seconds.  ?   Comments: Overall skin warm to touch, blisters to right upper arm closed, no drainage  ?Neurological:  ?   General: No focal deficit present.  ?   Mental Status: Mental status is at baseline.  ?   Motor: Weakness present.  ?   Gait: Gait abnormal.  ?   Comments: bedbound  ?Psychiatric:     ?   Attention and Perception: He is inattentive.     ?   Speech: He is noncommunicative.  ? ? ?Labs reviewed: ?Recent Labs  ?  01/27/21 ?0000 03/24/21 ?0000  ?  NA 145 144  ?K 3.7 3.8  ?CL 106 106  ?CO2 32* 34*  ?BUN 25* 24*  ?CREATININE 1.1 1.0  ?CALCIUM 9.5 9.2  ? ?Recent Labs  ?  01/06/21 ?0000 01/24/21 ?0000 01/27/21 ?0000  ?AST 17 15 12*  ?ALT '21 16 13  '$ ?ALKPHOS 51 57 55  ?ALBUMIN 3.8 3.7 3.9  ? ?Recent Labs  ?  12/14/20 ?0000 01/24/21 ?0000 01/27/21 ?0000  ?WBC 7.5 6.3 5.8  ?NEUTROABS  --   --  3,579.00  ?HGB 15.5 14.9 14.4  ?HCT 47 44 43  ?PLT 319 279 267  ? ?Lab Results  ?Component Value Date  ? TSH 1.46 09/13/2020  ? ?No results found for: HGBA1C ?Lab Results  ?Component Value Date  ? CHOL 177 09/13/2020  ? HDL 45 09/13/2020  ? [place] 109 09/13/2020  ? ? ?Significant Diagnostic Results in last 30 days:  ?No results found. ? ?Assessment/Plan ?1. Fever, unspecified fever cause ?- temp 101.8 this morning, warm to touch, O2 sats 87%, breathing unlabored, covid negative ?- left lower lobe diminished, suspect pneumonia ?- comfort care only per goals of care ?- will order tylenol suppository to help with fever symptoms ?- cont roxanol and ativan  ? ?2. End of life care ?- will discontinue all po medications ?- oxygen discontinued ?- see above ? ?Family/ staff Communication: plan discussed with wife, hospice nurse and nurse ? ?Labs/tests ordered:  none ? ?

## 2021-11-15 ENCOUNTER — Encounter: Payer: Self-pay | Admitting: Internal Medicine

## 2021-12-08 DEATH — deceased
